# Patient Record
Sex: Male | Born: 1970 | Race: Black or African American | Hispanic: No | Marital: Single | Smoking: Former smoker
Health system: Southern US, Community
[De-identification: ages and names within clinical notes are randomized; demographics above are authoritative.]

## PROBLEM LIST (undated history)

## (undated) DIAGNOSIS — F419 Anxiety disorder, unspecified: Secondary | ICD-10-CM

## (undated) DIAGNOSIS — G8929 Other chronic pain: Secondary | ICD-10-CM

## (undated) DIAGNOSIS — I10 Essential (primary) hypertension: Secondary | ICD-10-CM

## (undated) HISTORY — DX: Anxiety disorder, unspecified: F41.9

## (undated) HISTORY — DX: Other chronic pain: G89.29

## (undated) HISTORY — DX: Essential (primary) hypertension: I10

## (undated) HISTORY — PX: OTHER SURGICAL HISTORY: SHX169

---

## 2001-05-03 ENCOUNTER — Encounter: Payer: Self-pay | Admitting: Orthopedic Surgery

## 2001-05-03 ENCOUNTER — Encounter (INDEPENDENT_AMBULATORY_CARE_PROVIDER_SITE_OTHER): Payer: Self-pay

## 2001-05-03 ENCOUNTER — Observation Stay (HOSPITAL_COMMUNITY): Admission: RE | Admit: 2001-05-03 | Discharge: 2001-05-04 | Payer: Self-pay | Admitting: Orthopedic Surgery

## 2001-07-17 ENCOUNTER — Emergency Department (HOSPITAL_COMMUNITY): Admission: EM | Admit: 2001-07-17 | Discharge: 2001-07-17 | Payer: Self-pay | Admitting: Emergency Medicine

## 2001-12-04 ENCOUNTER — Emergency Department (HOSPITAL_COMMUNITY): Admission: EM | Admit: 2001-12-04 | Discharge: 2001-12-04 | Payer: Self-pay | Admitting: Internal Medicine

## 2001-12-04 ENCOUNTER — Encounter: Payer: Self-pay | Admitting: Internal Medicine

## 2002-01-20 ENCOUNTER — Emergency Department (HOSPITAL_COMMUNITY): Admission: EM | Admit: 2002-01-20 | Discharge: 2002-01-20 | Payer: Self-pay | Admitting: Emergency Medicine

## 2002-02-10 ENCOUNTER — Encounter: Payer: Self-pay | Admitting: Neurosurgery

## 2002-02-14 ENCOUNTER — Ambulatory Visit (HOSPITAL_COMMUNITY): Admission: RE | Admit: 2002-02-14 | Discharge: 2002-02-14 | Payer: Self-pay | Admitting: Neurosurgery

## 2002-02-14 ENCOUNTER — Encounter: Payer: Self-pay | Admitting: Neurosurgery

## 2004-01-01 ENCOUNTER — Emergency Department (HOSPITAL_COMMUNITY): Admission: EM | Admit: 2004-01-01 | Discharge: 2004-01-02 | Payer: Self-pay | Admitting: *Deleted

## 2004-02-16 ENCOUNTER — Encounter: Admission: RE | Admit: 2004-02-16 | Discharge: 2004-02-16 | Payer: Self-pay | Admitting: Neurosurgery

## 2004-03-06 ENCOUNTER — Encounter: Admission: RE | Admit: 2004-03-06 | Discharge: 2004-03-06 | Payer: Self-pay | Admitting: Neurosurgery

## 2005-09-02 ENCOUNTER — Emergency Department (HOSPITAL_COMMUNITY): Admission: EM | Admit: 2005-09-02 | Discharge: 2005-09-02 | Payer: Self-pay | Admitting: Emergency Medicine

## 2006-04-08 ENCOUNTER — Ambulatory Visit (HOSPITAL_COMMUNITY): Admission: RE | Admit: 2006-04-08 | Discharge: 2006-04-08 | Payer: Self-pay | Admitting: Family Medicine

## 2007-01-20 ENCOUNTER — Emergency Department (HOSPITAL_COMMUNITY): Admission: EM | Admit: 2007-01-20 | Discharge: 2007-01-20 | Payer: Self-pay | Admitting: Emergency Medicine

## 2008-07-15 ENCOUNTER — Emergency Department (HOSPITAL_COMMUNITY): Admission: EM | Admit: 2008-07-15 | Discharge: 2008-07-15 | Payer: Self-pay | Admitting: *Deleted

## 2008-07-25 ENCOUNTER — Emergency Department (HOSPITAL_COMMUNITY): Admission: EM | Admit: 2008-07-25 | Discharge: 2008-07-25 | Payer: Self-pay | Admitting: Emergency Medicine

## 2010-12-31 ENCOUNTER — Emergency Department (HOSPITAL_COMMUNITY)
Admission: EM | Admit: 2010-12-31 | Discharge: 2010-12-31 | Payer: Self-pay | Source: Home / Self Care | Admitting: Emergency Medicine

## 2011-01-19 ENCOUNTER — Encounter: Payer: Self-pay | Admitting: Neurosurgery

## 2011-03-10 LAB — URINALYSIS, ROUTINE W REFLEX MICROSCOPIC
Nitrite: NEGATIVE
Specific Gravity, Urine: <1.005 — ABNORMAL LOW (ref 1.005–1.030)
Urobilinogen, UA: 0.2 mg/dL (ref 0.0–1.0)

## 2011-03-10 LAB — BASIC METABOLIC PANEL
Calcium: 9.1 mg/dL (ref 8.4–10.5)
GFR calc Af Amer: >60 mL/min (ref >60)
GFR calc non Af Amer: >60 mL/min (ref >60)
Sodium: 137 mEq/L (ref 135–145)

## 2011-03-10 LAB — CBC
Hemoglobin: 14.6 g/dL (ref 13.0–17.0)
MCHC: 34.7 g/dL (ref 30.0–36.0)
RBC: 4.65 MIL/uL (ref 4.22–5.81)
WBC: 9.1 10*3/uL (ref 4.0–10.5)

## 2011-03-10 LAB — DIFFERENTIAL
Basophils Relative: 0 % (ref 0–1)
Lymphs Abs: 1.5 10*3/uL (ref 0.7–4.0)
Monocytes Relative: 9 % (ref 3–12)
Neutro Abs: 6.4 10*3/uL (ref 1.7–7.7)
Neutrophils Relative %: 71 % (ref 43–77)

## 2011-05-16 NOTE — Op Note (Signed)
[place]. [facility]  Patient:    Louis Sandoval, Louis Sandoval                       MRN: [id number] Proc. Date: 05/03/01 Adm. Date:  [id number] Disc. 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  All of his symptoms are in the left leg.  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  All of his symptoms are in the left leg.  OPERATION PERFORMED:  Hemilaminectomy and microdiskectomy at L5-S1 on the left.  SURGEON:  Georges Lynch. Darrelyn Hillock, M.D.  ASSISTANT:  Javier Docker, M.D.  ANESTHESIA:  General.  DESCRIPTION OF PROCEDURE:  Under general anesthesia, with the patient in a spinal frame, he first had 1 gm of IV Ancef.  At this time a routine prepping and draping of the back was carried out.  At this point two needles were placed in the back for localization purposes and an x-ray was taken to verify L5-S1.  Later a second x-ray was taken.  At this time an incision was made over L5-S1.  Bleeders were identified and cauterized.  I then went down and separated the muscle from the lamina by sharp dissection and then inserted the [facility] retractors and a second x-ray was taken.  The radiologist verified our position at L5-S1.  We then carried out a hemilaminectomy in the usual fashion.  We then removed the ligamentum flavum with great care taken to protect the dura.  The microscope was brought in and we then gently retracted the dura with the DErrico retractor and identified the nerve root.  He had a large herniated disk which was central and to the left.  We cauterized the lateral recess veins with a bipolar.  I then went on and made a cruciate incision in the L5-S1 disk space and did a complete diskectomy.  We were able to easily reach across the other side  subligamentously and remove the remaining disk material.  Once this was done, we made multiple passes into the space and there were no other free fragments.  We then were easily able to run a hockey stick down the foramina and nerve root and that was free.  We thoroughly irrigated out the wound side.  Good hemostasis was maintained with thrombin soaked Gelfoam. The wound was closed in layers in the usual fashion. Sterile Neosporin dressing was applied. DD:  05/03/01 TD:  05/04/01 Job: [id number]

## 2011-05-16 NOTE — Op Note (Signed)
[place]. [facility]  Patient:    Louis Sandoval, RODELO Visit Number: [id number] MRN: [id number]          Service Type: DSU Location: 3000 [id number] Attending Physician:  Donn Pierini Dictated by:   Julio Sicks, M.D. Proc. 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  OPERATION PERFORMED:  Re-exploration of left L5-S1 laminotomy and microdiskectomy.  SURGEON:  Julio Sicks, M.D.  ASSISTANT:  Bradd Canary., M.D.  ANESTHESIA:  General endotracheal.  INDICATIONS FOR PROCEDURE:  Louis Sandoval is a 40 year old male who is status post previous left-sided L5-S1 laminotomy and diskectomy by Dr. Darrelyn Hillock approximately one year go.  The patient initially had some relieve and approximately four weeks after surgery developed recurrent left lower extremity radicular pain consistent with a left-sided S1 radiculopathy. Work-ups demonstrated a recurrent left-sided L5-S1 disk herniation with compression of the left-sided S1 nerve root.  The patient has failed conservative management.  He has decided to proceed with a re-do microdiskectomy in hopes of improving his symptoms.  DESCRIPTION OF PROCEDURE:  The patient was taken to the operating room and placed on the operating table in supine position.  After an adequate level of anesthesia was achieved, the patient was positioned prone onto a Wilson frame and appropriately padded.  The patients lumbar region was shaved and prepped sterilely.  A 10 blade was used to make a linear skin incision overlying the interspace.  This was carried down sharply in the midline.  Subperiosteal dissection was performed on the left side exposing the lamina and facet joints of L5  and S1.  The deep self-retaining retractor was placed.  Intraoperative x-ray was taken and the level was confirmed.  The old laminotomy site was then dissected free using dental instruments.  Epidural scarring was resected.  The laminotomy was slightly widened using Kerrison rongeurs.  The underlying thecal sac was identified.  Microscope was brought into the field and used for microdissection of the left-sided S1 nerve root and underlying disk herniation.  The left-sided S1 pedicle was identified as was the left-sided S1 nerve root.  Epidural scar was freed laterally.  The thecal sac and S1 nerve root were mobilized medially and held in place with a DErrico nerve root retractor.  The epidural scar was further lysed superiorly and the recurrent disk herniation was readily apparent.  With the thecal sac and nerve roots protected, the disk herniation was then incised with a 15 blade.  A wide disk space clean out was then achieved using pituitary rongeurs, upward angled pituitary rongeurs and Epstein curets.  All loose or obviously degenerated disk material was removed from the interspace.  All elements follow-up disk herniation were completely resected.  The thecal sac and nerve roots were found to be free of any further compression.  There was no evidence of injury to the thecal sac or nerve roots.  THe wound was then copiously irrigated with antibiotic solution.  Gelfoam was placed topically for hemostasis which was found to be good.  Microscope and retractor system were removed.  Hemostasis in the muscle was achieved with electrocautery.  The wound was then closed in layers with Vicryl suture.  Steri-Strips and sterile dressing were applied. There were no apparent complications.  The patient tolerated the procedure well and he returned to the recovery room postoperatively. Dictated by:   Julio Sicks, M.D. Attending Physician:  Donn Pierini DD:  02/14/02 TD:  02/14/02 Job:  [id number]

## 2016-04-29 DIAGNOSIS — M545 Low back pain: Secondary | ICD-10-CM | POA: Diagnosis not present

## 2016-04-29 DIAGNOSIS — Z6831 Body mass index (BMI) 31.0-31.9, adult: Secondary | ICD-10-CM | POA: Diagnosis not present

## 2016-04-29 DIAGNOSIS — I10 Essential (primary) hypertension: Secondary | ICD-10-CM | POA: Diagnosis not present

## 2016-07-02 DIAGNOSIS — E119 Type 2 diabetes mellitus without complications: Secondary | ICD-10-CM | POA: Diagnosis not present

## 2016-07-02 DIAGNOSIS — Z6830 Body mass index (BMI) 30.0-30.9, adult: Secondary | ICD-10-CM | POA: Diagnosis not present

## 2016-07-02 DIAGNOSIS — M545 Low back pain: Secondary | ICD-10-CM | POA: Diagnosis not present

## 2016-07-02 DIAGNOSIS — I10 Essential (primary) hypertension: Secondary | ICD-10-CM | POA: Diagnosis not present

## 2016-10-02 DIAGNOSIS — I10 Essential (primary) hypertension: Secondary | ICD-10-CM | POA: Diagnosis not present

## 2016-10-02 DIAGNOSIS — E119 Type 2 diabetes mellitus without complications: Secondary | ICD-10-CM | POA: Diagnosis not present

## 2016-10-02 DIAGNOSIS — Z6831 Body mass index (BMI) 31.0-31.9, adult: Secondary | ICD-10-CM | POA: Diagnosis not present

## 2016-10-02 DIAGNOSIS — M545 Low back pain: Secondary | ICD-10-CM | POA: Diagnosis not present

## 2016-12-02 DIAGNOSIS — E119 Type 2 diabetes mellitus without complications: Secondary | ICD-10-CM | POA: Diagnosis not present

## 2016-12-02 DIAGNOSIS — M545 Low back pain: Secondary | ICD-10-CM | POA: Diagnosis not present

## 2016-12-02 DIAGNOSIS — Z6832 Body mass index (BMI) 32.0-32.9, adult: Secondary | ICD-10-CM | POA: Diagnosis not present

## 2016-12-02 DIAGNOSIS — I10 Essential (primary) hypertension: Secondary | ICD-10-CM | POA: Diagnosis not present

## 2017-01-30 DIAGNOSIS — Z6832 Body mass index (BMI) 32.0-32.9, adult: Secondary | ICD-10-CM | POA: Diagnosis not present

## 2017-01-30 DIAGNOSIS — M545 Low back pain: Secondary | ICD-10-CM | POA: Diagnosis not present

## 2017-01-30 DIAGNOSIS — I10 Essential (primary) hypertension: Secondary | ICD-10-CM | POA: Diagnosis not present

## 2017-01-30 DIAGNOSIS — E119 Type 2 diabetes mellitus without complications: Secondary | ICD-10-CM | POA: Diagnosis not present

## 2017-03-30 DIAGNOSIS — M65222 Calcific tendinitis, left upper arm: Secondary | ICD-10-CM | POA: Diagnosis not present

## 2017-03-30 DIAGNOSIS — M545 Low back pain: Secondary | ICD-10-CM | POA: Diagnosis not present

## 2017-03-30 DIAGNOSIS — I10 Essential (primary) hypertension: Secondary | ICD-10-CM | POA: Diagnosis not present

## 2017-03-30 DIAGNOSIS — E119 Type 2 diabetes mellitus without complications: Secondary | ICD-10-CM | POA: Diagnosis not present

## 2017-06-01 DIAGNOSIS — E119 Type 2 diabetes mellitus without complications: Secondary | ICD-10-CM | POA: Diagnosis not present

## 2017-06-01 DIAGNOSIS — Z6833 Body mass index (BMI) 33.0-33.9, adult: Secondary | ICD-10-CM | POA: Diagnosis not present

## 2017-06-01 DIAGNOSIS — I10 Essential (primary) hypertension: Secondary | ICD-10-CM | POA: Diagnosis not present

## 2017-06-01 DIAGNOSIS — M545 Low back pain: Secondary | ICD-10-CM | POA: Diagnosis not present

## 2017-08-24 DIAGNOSIS — E119 Type 2 diabetes mellitus without complications: Secondary | ICD-10-CM | POA: Diagnosis not present

## 2017-08-24 DIAGNOSIS — I10 Essential (primary) hypertension: Secondary | ICD-10-CM | POA: Diagnosis not present

## 2017-08-24 DIAGNOSIS — M545 Low back pain: Secondary | ICD-10-CM | POA: Diagnosis not present

## 2017-08-24 DIAGNOSIS — Z6833 Body mass index (BMI) 33.0-33.9, adult: Secondary | ICD-10-CM | POA: Diagnosis not present

## 2017-08-28 DIAGNOSIS — E119 Type 2 diabetes mellitus without complications: Secondary | ICD-10-CM | POA: Diagnosis not present

## 2017-10-19 DIAGNOSIS — I10 Essential (primary) hypertension: Secondary | ICD-10-CM | POA: Diagnosis not present

## 2017-10-19 DIAGNOSIS — E119 Type 2 diabetes mellitus without complications: Secondary | ICD-10-CM | POA: Diagnosis not present

## 2017-10-19 DIAGNOSIS — M545 Low back pain: Secondary | ICD-10-CM | POA: Diagnosis not present

## 2017-10-19 DIAGNOSIS — Z6832 Body mass index (BMI) 32.0-32.9, adult: Secondary | ICD-10-CM | POA: Diagnosis not present

## 2017-12-24 DIAGNOSIS — Z6832 Body mass index (BMI) 32.0-32.9, adult: Secondary | ICD-10-CM | POA: Diagnosis not present

## 2017-12-24 DIAGNOSIS — M545 Low back pain: Secondary | ICD-10-CM | POA: Diagnosis not present

## 2017-12-24 DIAGNOSIS — E119 Type 2 diabetes mellitus without complications: Secondary | ICD-10-CM | POA: Diagnosis not present

## 2017-12-24 DIAGNOSIS — I10 Essential (primary) hypertension: Secondary | ICD-10-CM | POA: Diagnosis not present

## 2018-02-23 DIAGNOSIS — I10 Essential (primary) hypertension: Secondary | ICD-10-CM | POA: Diagnosis not present

## 2018-02-23 DIAGNOSIS — D213 Benign neoplasm of connective and other soft tissue of thorax: Secondary | ICD-10-CM | POA: Diagnosis not present

## 2018-02-23 DIAGNOSIS — I781 Nevus, non-neoplastic: Secondary | ICD-10-CM | POA: Diagnosis not present

## 2018-02-23 DIAGNOSIS — E119 Type 2 diabetes mellitus without complications: Secondary | ICD-10-CM | POA: Diagnosis not present

## 2018-02-23 DIAGNOSIS — Z6832 Body mass index (BMI) 32.0-32.9, adult: Secondary | ICD-10-CM | POA: Diagnosis not present

## 2018-02-23 DIAGNOSIS — M545 Low back pain: Secondary | ICD-10-CM | POA: Diagnosis not present

## 2018-02-23 DIAGNOSIS — Z6831 Body mass index (BMI) 31.0-31.9, adult: Secondary | ICD-10-CM | POA: Diagnosis not present

## 2018-04-19 DIAGNOSIS — E119 Type 2 diabetes mellitus without complications: Secondary | ICD-10-CM | POA: Diagnosis not present

## 2018-04-19 DIAGNOSIS — I10 Essential (primary) hypertension: Secondary | ICD-10-CM | POA: Diagnosis not present

## 2018-04-19 DIAGNOSIS — M7918 Myalgia, other site: Secondary | ICD-10-CM | POA: Diagnosis not present

## 2018-04-19 DIAGNOSIS — M545 Low back pain: Secondary | ICD-10-CM | POA: Diagnosis not present

## 2018-07-02 DIAGNOSIS — M545 Low back pain: Secondary | ICD-10-CM | POA: Diagnosis not present

## 2018-07-02 DIAGNOSIS — M7918 Myalgia, other site: Secondary | ICD-10-CM | POA: Diagnosis not present

## 2018-07-02 DIAGNOSIS — I10 Essential (primary) hypertension: Secondary | ICD-10-CM | POA: Diagnosis not present

## 2018-07-02 DIAGNOSIS — E119 Type 2 diabetes mellitus without complications: Secondary | ICD-10-CM | POA: Diagnosis not present

## 2018-07-08 DIAGNOSIS — I201 Angina pectoris with documented spasm: Secondary | ICD-10-CM | POA: Diagnosis not present

## 2018-07-09 DIAGNOSIS — I201 Angina pectoris with documented spasm: Secondary | ICD-10-CM | POA: Diagnosis not present

## 2018-07-20 DIAGNOSIS — Z6830 Body mass index (BMI) 30.0-30.9, adult: Secondary | ICD-10-CM | POA: Diagnosis not present

## 2018-07-20 DIAGNOSIS — I10 Essential (primary) hypertension: Secondary | ICD-10-CM | POA: Diagnosis not present

## 2018-07-29 DIAGNOSIS — E669 Obesity, unspecified: Secondary | ICD-10-CM | POA: Diagnosis not present

## 2018-07-29 DIAGNOSIS — Z6832 Body mass index (BMI) 32.0-32.9, adult: Secondary | ICD-10-CM | POA: Diagnosis not present

## 2018-07-29 DIAGNOSIS — Z008 Encounter for other general examination: Secondary | ICD-10-CM | POA: Diagnosis not present

## 2018-07-29 DIAGNOSIS — Z1339 Encounter for screening examination for other mental health and behavioral disorders: Secondary | ICD-10-CM | POA: Diagnosis not present

## 2018-09-14 DIAGNOSIS — Z6830 Body mass index (BMI) 30.0-30.9, adult: Secondary | ICD-10-CM | POA: Diagnosis not present

## 2018-09-14 DIAGNOSIS — Z0000 Encounter for general adult medical examination without abnormal findings: Secondary | ICD-10-CM | POA: Diagnosis not present

## 2018-09-14 DIAGNOSIS — Z6829 Body mass index (BMI) 29.0-29.9, adult: Secondary | ICD-10-CM | POA: Diagnosis not present

## 2018-09-14 DIAGNOSIS — I10 Essential (primary) hypertension: Secondary | ICD-10-CM | POA: Diagnosis not present

## 2018-11-01 DIAGNOSIS — I10 Essential (primary) hypertension: Secondary | ICD-10-CM | POA: Diagnosis not present

## 2018-11-01 DIAGNOSIS — Z79891 Long term (current) use of opiate analgesic: Secondary | ICD-10-CM | POA: Diagnosis not present

## 2018-11-01 DIAGNOSIS — M545 Low back pain: Secondary | ICD-10-CM | POA: Diagnosis not present

## 2018-11-01 DIAGNOSIS — E119 Type 2 diabetes mellitus without complications: Secondary | ICD-10-CM | POA: Diagnosis not present

## 2018-11-01 DIAGNOSIS — Z6829 Body mass index (BMI) 29.0-29.9, adult: Secondary | ICD-10-CM | POA: Diagnosis not present

## 2018-12-31 DIAGNOSIS — M545 Low back pain: Secondary | ICD-10-CM | POA: Diagnosis not present

## 2018-12-31 DIAGNOSIS — I10 Essential (primary) hypertension: Secondary | ICD-10-CM | POA: Diagnosis not present

## 2018-12-31 DIAGNOSIS — Z6829 Body mass index (BMI) 29.0-29.9, adult: Secondary | ICD-10-CM | POA: Diagnosis not present

## 2018-12-31 DIAGNOSIS — E119 Type 2 diabetes mellitus without complications: Secondary | ICD-10-CM | POA: Diagnosis not present

## 2019-01-27 DIAGNOSIS — E669 Obesity, unspecified: Secondary | ICD-10-CM | POA: Diagnosis not present

## 2019-01-27 DIAGNOSIS — Z008 Encounter for other general examination: Secondary | ICD-10-CM | POA: Diagnosis not present

## 2019-01-27 DIAGNOSIS — R7303 Prediabetes: Secondary | ICD-10-CM | POA: Diagnosis not present

## 2019-01-27 DIAGNOSIS — Z6830 Body mass index (BMI) 30.0-30.9, adult: Secondary | ICD-10-CM | POA: Diagnosis not present

## 2019-03-01 DIAGNOSIS — E119 Type 2 diabetes mellitus without complications: Secondary | ICD-10-CM | POA: Diagnosis not present

## 2019-03-01 DIAGNOSIS — Z6827 Body mass index (BMI) 27.0-27.9, adult: Secondary | ICD-10-CM | POA: Diagnosis not present

## 2019-03-01 DIAGNOSIS — M545 Low back pain: Secondary | ICD-10-CM | POA: Diagnosis not present

## 2019-03-01 DIAGNOSIS — I10 Essential (primary) hypertension: Secondary | ICD-10-CM | POA: Diagnosis not present

## 2019-05-02 DIAGNOSIS — E119 Type 2 diabetes mellitus without complications: Secondary | ICD-10-CM | POA: Diagnosis not present

## 2019-05-02 DIAGNOSIS — M545 Low back pain: Secondary | ICD-10-CM | POA: Diagnosis not present

## 2019-05-02 DIAGNOSIS — I10 Essential (primary) hypertension: Secondary | ICD-10-CM | POA: Diagnosis not present

## 2019-05-02 DIAGNOSIS — Z6829 Body mass index (BMI) 29.0-29.9, adult: Secondary | ICD-10-CM | POA: Diagnosis not present

## 2019-07-20 DIAGNOSIS — Z6830 Body mass index (BMI) 30.0-30.9, adult: Secondary | ICD-10-CM | POA: Diagnosis not present

## 2019-07-20 DIAGNOSIS — M545 Low back pain: Secondary | ICD-10-CM | POA: Diagnosis not present

## 2019-07-20 DIAGNOSIS — I10 Essential (primary) hypertension: Secondary | ICD-10-CM | POA: Diagnosis not present

## 2019-07-20 DIAGNOSIS — E119 Type 2 diabetes mellitus without complications: Secondary | ICD-10-CM | POA: Diagnosis not present

## 2019-09-12 DIAGNOSIS — E119 Type 2 diabetes mellitus without complications: Secondary | ICD-10-CM | POA: Diagnosis not present

## 2019-09-12 DIAGNOSIS — I10 Essential (primary) hypertension: Secondary | ICD-10-CM | POA: Diagnosis not present

## 2019-09-12 DIAGNOSIS — M545 Low back pain: Secondary | ICD-10-CM | POA: Diagnosis not present

## 2019-09-12 DIAGNOSIS — K2970 Gastritis, unspecified, without bleeding: Secondary | ICD-10-CM | POA: Diagnosis not present

## 2019-09-15 DIAGNOSIS — E119 Type 2 diabetes mellitus without complications: Secondary | ICD-10-CM | POA: Diagnosis not present

## 2019-09-15 DIAGNOSIS — B9681 Helicobacter pylori [H. pylori] as the cause of diseases classified elsewhere: Secondary | ICD-10-CM | POA: Diagnosis not present

## 2019-09-15 DIAGNOSIS — K2970 Gastritis, unspecified, without bleeding: Secondary | ICD-10-CM | POA: Diagnosis not present

## 2019-09-15 DIAGNOSIS — I10 Essential (primary) hypertension: Secondary | ICD-10-CM | POA: Diagnosis not present

## 2019-11-15 DIAGNOSIS — Z6830 Body mass index (BMI) 30.0-30.9, adult: Secondary | ICD-10-CM | POA: Diagnosis not present

## 2019-11-15 DIAGNOSIS — Z0000 Encounter for general adult medical examination without abnormal findings: Secondary | ICD-10-CM | POA: Diagnosis not present

## 2020-01-16 DIAGNOSIS — Z6830 Body mass index (BMI) 30.0-30.9, adult: Secondary | ICD-10-CM | POA: Diagnosis not present

## 2020-01-16 DIAGNOSIS — I10 Essential (primary) hypertension: Secondary | ICD-10-CM | POA: Diagnosis not present

## 2020-01-16 DIAGNOSIS — M545 Low back pain: Secondary | ICD-10-CM | POA: Diagnosis not present

## 2020-01-16 DIAGNOSIS — E119 Type 2 diabetes mellitus without complications: Secondary | ICD-10-CM | POA: Diagnosis not present

## 2020-03-14 DIAGNOSIS — I10 Essential (primary) hypertension: Secondary | ICD-10-CM | POA: Diagnosis not present

## 2020-03-14 DIAGNOSIS — M545 Low back pain: Secondary | ICD-10-CM | POA: Diagnosis not present

## 2020-03-14 DIAGNOSIS — E119 Type 2 diabetes mellitus without complications: Secondary | ICD-10-CM | POA: Diagnosis not present

## 2020-03-14 DIAGNOSIS — Z6830 Body mass index (BMI) 30.0-30.9, adult: Secondary | ICD-10-CM | POA: Diagnosis not present

## 2020-04-30 DIAGNOSIS — I10 Essential (primary) hypertension: Secondary | ICD-10-CM | POA: Diagnosis not present

## 2020-04-30 DIAGNOSIS — Z6830 Body mass index (BMI) 30.0-30.9, adult: Secondary | ICD-10-CM | POA: Diagnosis not present

## 2020-04-30 DIAGNOSIS — E119 Type 2 diabetes mellitus without complications: Secondary | ICD-10-CM | POA: Diagnosis not present

## 2020-04-30 DIAGNOSIS — M545 Low back pain: Secondary | ICD-10-CM | POA: Diagnosis not present

## 2020-07-04 DIAGNOSIS — I10 Essential (primary) hypertension: Secondary | ICD-10-CM | POA: Diagnosis not present

## 2020-07-04 DIAGNOSIS — Z6831 Body mass index (BMI) 31.0-31.9, adult: Secondary | ICD-10-CM | POA: Diagnosis not present

## 2020-07-04 DIAGNOSIS — E119 Type 2 diabetes mellitus without complications: Secondary | ICD-10-CM | POA: Diagnosis not present

## 2020-07-04 DIAGNOSIS — M545 Low back pain: Secondary | ICD-10-CM | POA: Diagnosis not present

## 2020-09-04 DIAGNOSIS — Z6831 Body mass index (BMI) 31.0-31.9, adult: Secondary | ICD-10-CM | POA: Diagnosis not present

## 2020-09-04 DIAGNOSIS — I10 Essential (primary) hypertension: Secondary | ICD-10-CM | POA: Diagnosis not present

## 2020-09-04 DIAGNOSIS — E1143 Type 2 diabetes mellitus with diabetic autonomic (poly)neuropathy: Secondary | ICD-10-CM | POA: Diagnosis not present

## 2020-09-04 DIAGNOSIS — M545 Low back pain: Secondary | ICD-10-CM | POA: Diagnosis not present

## 2020-11-08 DIAGNOSIS — E1143 Type 2 diabetes mellitus with diabetic autonomic (poly)neuropathy: Secondary | ICD-10-CM | POA: Diagnosis not present

## 2020-11-08 DIAGNOSIS — F411 Generalized anxiety disorder: Secondary | ICD-10-CM | POA: Diagnosis not present

## 2020-11-08 DIAGNOSIS — I10 Essential (primary) hypertension: Secondary | ICD-10-CM | POA: Diagnosis not present

## 2020-11-08 DIAGNOSIS — M545 Low back pain, unspecified: Secondary | ICD-10-CM | POA: Diagnosis not present

## 2021-01-02 DIAGNOSIS — F411 Generalized anxiety disorder: Secondary | ICD-10-CM | POA: Diagnosis not present

## 2021-01-02 DIAGNOSIS — E1143 Type 2 diabetes mellitus with diabetic autonomic (poly)neuropathy: Secondary | ICD-10-CM | POA: Diagnosis not present

## 2021-01-02 DIAGNOSIS — M545 Low back pain, unspecified: Secondary | ICD-10-CM | POA: Diagnosis not present

## 2021-01-02 DIAGNOSIS — I10 Essential (primary) hypertension: Secondary | ICD-10-CM | POA: Diagnosis not present

## 2021-02-01 DIAGNOSIS — I10 Essential (primary) hypertension: Secondary | ICD-10-CM | POA: Diagnosis not present

## 2021-02-01 DIAGNOSIS — M545 Low back pain, unspecified: Secondary | ICD-10-CM | POA: Diagnosis not present

## 2021-02-01 DIAGNOSIS — E1143 Type 2 diabetes mellitus with diabetic autonomic (poly)neuropathy: Secondary | ICD-10-CM | POA: Diagnosis not present

## 2021-03-04 DIAGNOSIS — M545 Low back pain, unspecified: Secondary | ICD-10-CM | POA: Diagnosis not present

## 2021-03-04 DIAGNOSIS — I10 Essential (primary) hypertension: Secondary | ICD-10-CM | POA: Diagnosis not present

## 2021-03-04 DIAGNOSIS — F411 Generalized anxiety disorder: Secondary | ICD-10-CM | POA: Diagnosis not present

## 2021-03-04 DIAGNOSIS — E1143 Type 2 diabetes mellitus with diabetic autonomic (poly)neuropathy: Secondary | ICD-10-CM | POA: Diagnosis not present

## 2021-03-06 ENCOUNTER — Encounter (INDEPENDENT_AMBULATORY_CARE_PROVIDER_SITE_OTHER): Payer: Self-pay | Admitting: *Deleted

## 2021-05-08 DIAGNOSIS — Z0000 Encounter for general adult medical examination without abnormal findings: Secondary | ICD-10-CM | POA: Diagnosis not present

## 2021-05-08 DIAGNOSIS — E1143 Type 2 diabetes mellitus with diabetic autonomic (poly)neuropathy: Secondary | ICD-10-CM | POA: Diagnosis not present

## 2021-05-08 DIAGNOSIS — M545 Low back pain, unspecified: Secondary | ICD-10-CM | POA: Diagnosis not present

## 2021-05-08 DIAGNOSIS — I10 Essential (primary) hypertension: Secondary | ICD-10-CM | POA: Diagnosis not present

## 2021-05-08 DIAGNOSIS — F411 Generalized anxiety disorder: Secondary | ICD-10-CM | POA: Diagnosis not present

## 2021-07-10 DIAGNOSIS — F411 Generalized anxiety disorder: Secondary | ICD-10-CM | POA: Diagnosis not present

## 2021-07-10 DIAGNOSIS — M545 Low back pain, unspecified: Secondary | ICD-10-CM | POA: Diagnosis not present

## 2021-07-10 DIAGNOSIS — I10 Essential (primary) hypertension: Secondary | ICD-10-CM | POA: Diagnosis not present

## 2021-07-10 DIAGNOSIS — E1143 Type 2 diabetes mellitus with diabetic autonomic (poly)neuropathy: Secondary | ICD-10-CM | POA: Diagnosis not present

## 2021-09-09 DIAGNOSIS — E1143 Type 2 diabetes mellitus with diabetic autonomic (poly)neuropathy: Secondary | ICD-10-CM | POA: Diagnosis not present

## 2021-09-12 ENCOUNTER — Encounter (INDEPENDENT_AMBULATORY_CARE_PROVIDER_SITE_OTHER): Payer: Self-pay | Admitting: *Deleted

## 2021-10-02 ENCOUNTER — Encounter: Payer: Self-pay | Admitting: Internal Medicine

## 2021-11-13 DIAGNOSIS — Z125 Encounter for screening for malignant neoplasm of prostate: Secondary | ICD-10-CM | POA: Diagnosis not present

## 2021-11-13 DIAGNOSIS — I10 Essential (primary) hypertension: Secondary | ICD-10-CM | POA: Diagnosis not present

## 2021-11-13 DIAGNOSIS — K921 Melena: Secondary | ICD-10-CM | POA: Diagnosis not present

## 2021-11-13 DIAGNOSIS — E1143 Type 2 diabetes mellitus with diabetic autonomic (poly)neuropathy: Secondary | ICD-10-CM | POA: Diagnosis not present

## 2021-11-13 DIAGNOSIS — F411 Generalized anxiety disorder: Secondary | ICD-10-CM | POA: Diagnosis not present

## 2022-01-09 DIAGNOSIS — E1143 Type 2 diabetes mellitus with diabetic autonomic (poly)neuropathy: Secondary | ICD-10-CM | POA: Diagnosis not present

## 2022-01-14 ENCOUNTER — Other Ambulatory Visit: Payer: Self-pay | Admitting: Internal Medicine

## 2022-01-14 ENCOUNTER — Other Ambulatory Visit (HOSPITAL_COMMUNITY): Payer: Self-pay | Admitting: Internal Medicine

## 2022-01-14 DIAGNOSIS — G4489 Other headache syndrome: Secondary | ICD-10-CM

## 2022-01-29 ENCOUNTER — Other Ambulatory Visit: Payer: Self-pay

## 2022-01-29 ENCOUNTER — Ambulatory Visit (HOSPITAL_COMMUNITY)
Admission: RE | Admit: 2022-01-29 | Discharge: 2022-01-29 | Disposition: A | Discharge status: Home / Self Care | Payer: BC Managed Care – PPO | Source: Ambulatory Visit | Attending: Internal Medicine | Admitting: Internal Medicine

## 2022-01-29 DIAGNOSIS — G4489 Other headache syndrome: Secondary | ICD-10-CM | POA: Insufficient documentation

## 2022-01-29 DIAGNOSIS — R519 Headache, unspecified: Secondary | ICD-10-CM | POA: Diagnosis not present

## 2022-02-19 NOTE — Progress Notes (Signed)
Referring Provider: Neale Burly, MD Primary Care Physician:  Neale Burly, MD Primary Gastroenterologist:  Dr. Abbey Chatters  Chief Complaint  Patient presents with   Colonoscopy    Never had tcs. Occ constipation. Mother had breast cancer. Grandfather had stomach cancer.    HPI:   Louis Sandoval is a 51 y.o. male presenting today at the request of Louis Sandoval, Louis Dada, MD for consult colonoscopy, OV due to meds and constipation.   Today:  Patient reports he has never had a colonoscopy.  Admits to occasional constipation. Takes norco daily for back and knee pain. Usually with Bms 1-2 times a day. Occasional use of OTC stool softener. Last used about 1 month ago. Has increased fruit and water intake. Occasional toilet tissue hematochezia, intermittent for about 1 year. No rectal pain. No melena. No unintentional weight loss. Trying to limit fried foods and cut back on portion sizes. Staying around 235 lbs- 240 lbs. Trying to get to 230 lbs.   Takes Protonix once every couple of weeks, can be as frequent as a couple times a week. Feels like this contributes to constipation. Started Protonix a few years ago due to upper abdominal pain. Burning sensation. This usually occurs a couple times a month. Depends on what he eats. Triggered by tomato based products. No heartburn symptoms. No dysphasia.   Occasional ibuprofen 800 mg- once a month. No BC powders or goody powders.   Also reports occasional slight discomfort in his chest.  Had a treadmill stress test last year that was normal. No change in symptoms.  Denies palpitations or shortness of breath.  Mother with history of breast cancer.  Maternal grandfather with history of stomach cancer.   Past Medical History:  Diagnosis Date   Anxiety    Chronic back pain    Chronic knee pain    left, has a rod   HTN (hypertension)     Past Surgical History:  Procedure Laterality Date   rod placed in left leg      Current Outpatient Medications   Medication Sig Dispense Refill   amLODipine (NORVASC) 5 MG tablet Take 5 mg by mouth daily.     aspirin EC 81 MG tablet Take 81 mg by mouth daily. Swallow whole.     busPIRone (BUSPAR) 10 MG tablet Take 10 mg by mouth 2 (two) times daily.     gabapentin (NEURONTIN) 300 MG capsule Take 300 mg by mouth 3 (three) times daily.     HYDROcodone-acetaminophen (NORCO) 10-325 MG tablet Take 1 tablet by mouth 3 (three) times daily.     loratadine (CLARITIN) 10 MG tablet Take 1 tablet by mouth daily.     Multiple Vitamin (MULTIVITAMIN) tablet Take 1 tablet by mouth daily.     pantoprazole (PROTONIX) 40 MG tablet Take 40 mg by mouth daily.     potassium chloride SA (KLOR-CON M) 20 MEQ tablet Take 20 mEq by mouth daily.     No current facility-administered medications for this visit.    Allergies as of 02/20/2022 - Review Complete 02/20/2022  Allergen Reaction Noted   Penicillins Anaphylaxis 02/20/2022    Family History  Problem Relation Age of Onset   Breast cancer Mother    Gastric cancer Maternal Grandfather    Colon cancer Neg Hx     Social History   Socioeconomic History   Marital status: Single    Spouse name: Not on file   Number of children: Not on file   Years  of education: Not on file   Highest education level: Not on file  Occupational History   Not on file  Tobacco Use   Smoking status: Former    Types: Cigarettes   Smokeless tobacco: Never  Substance and Sexual Activity   Alcohol use: Yes    Comment: occ now; more etoh in past   Drug use: Not Currently   Sexual activity: Not on file  Other Topics Concern   Not on file  Social History Narrative   Not on file   Social Determinants of Health   Financial Resource Strain: Not on file  Food Insecurity: Not on file  Transportation Needs: Not on file  Physical Activity: Not on file  Stress: Not on file  Social Connections: Not on file  Intimate Partner Violence: Not on file    Review of Systems: Gen: Denies any  fever, chills, cold or flulike symptoms, presyncope, syncope. CV: See HPI Resp: Denies shortness of breath or cough. GI: See HPI GU : Denies urinary burning, urinary frequency, urinary hesitancy MS: Admits to chronic back and left knee pain. Derm: Denies rash. Psych: Denies depression, anxiety. Heme: See HPI  Physical Exam: BP 136/86    Pulse 60    Temp (!) 96.8 F (36 C) (Temporal)    Ht 6\' 1"  (1.854 m)    Wt 242 lb 12.8 oz (110.1 kg)    BMI 32.03 kg/m  General:   Alert and oriented. Pleasant and cooperative. Well-nourished and well-developed.  Head:  Normocephalic and atraumatic. Eyes:  Without icterus, sclera clear and conjunctiva pink.  Ears:  Normal auditory acuity. Lungs:  Clear to auscultation bilaterally. No wheezes, rales, or rhonchi. No distress.  Heart:  S1, S2 present without murmurs appreciated.  Abdomen:  +BS, soft, non-tender and non-distended. No HSM noted. No guarding or rebound. No masses appreciated.  Rectal:  Deferred  Msk:  Symmetrical without gross deformities. Normal posture. Extremities:  Without edema. Neurologic:  Alert and  oriented x4;  grossly normal neurologically. Skin:  Intact without significant lesions or rashes. Psych: Normal mood and affect.    Assessment: 51 year old male with history of chronic back and knee pain, HTN, anxiety, presenting today at the request of Dr.  Sherrie Sport for consult colonoscopy, also reporting intermittent hematochezia, occasional constipation, and epigastric burning.   Rectal bleeding:  1 year history of intermittent toilet tissue hematochezia without associated rectal pain.  Denies abdominal pain, melena, or unintentional weight loss.  He does have history of mild intermittent constipation discussed below.  No prior colonoscopy.  No family history of colon cancer.  I suspect rectal bleeding could be from benign anorectal source such as hemorrhoids, but unable to rule out colon polyps or malignancy.  Needs colonoscopy for  further evaluation.  We will also check CBC to evaluate for anemia.  Constipation: Occasional constipation in the setting of Norco and possibly influenced by Protonix as well.  Uses OTC stool softeners as needed, but has not needed it about 1 month as he has increased fruit and water consumption.  Now having 1-2 BMs daily.  Encouraged to continue with his dietary adjustments and recommended adding Benefiber or Metamucil daily.  Epigastric pain: Few year history of intermittent epigastric burning, often triggered by tomato based products/spicy foods, taking Protonix as needed, usually once every couple of weeks, but can be as frequent as a couple times a week.  Denies nausea, vomiting, or typical heartburn symptoms.  No unintentional weight loss.  Takes 81 mg aspirin daily,  800 mg ibuprofen about once a month.  Maternal grandfather with history of gastric cancer.  I suspect he has gastritis or possibly atypical GERD, but cannot rule out H. pylori, PUD, or malignancy.  Offered EGD for further evaluation, but patient preferred to hold off for now.  Recommended holding Protonix x2 weeks and checking for H. pylori.   Plan:  Proceed with colonoscopy with propofol with Dr. Abbey Chatters in the near future.The risks, benefits, and alternatives have been discussed with the patient in detail. The patient states understanding and desires to proceed. ASA 2 CBC. Hold Protonix and avoid all over-the-counter antacid medications x2 weeks, then complete H. pylori breath test. Avoid known dietary triggers of epigastric burning. Avoid NSAID products. Start Metamucil or Benefiber daily. Follow-up after procedure.  Advised to call sooner with questions or concerns.   Aliene Altes, PA-C [facility] 02/20/2022

## 2022-02-20 ENCOUNTER — Ambulatory Visit (INDEPENDENT_AMBULATORY_CARE_PROVIDER_SITE_OTHER): Payer: BC Managed Care – PPO | Admitting: Gastroenterology

## 2022-02-20 ENCOUNTER — Other Ambulatory Visit: Payer: Self-pay

## 2022-02-20 ENCOUNTER — Encounter: Payer: Self-pay | Admitting: Gastroenterology

## 2022-02-20 VITALS — BP 136/86 | HR 60 | Temp 96.8°F | Ht 73.0 in | Wt 242.8 lb

## 2022-02-20 DIAGNOSIS — R1013 Epigastric pain: Secondary | ICD-10-CM | POA: Diagnosis not present

## 2022-02-20 DIAGNOSIS — K5900 Constipation, unspecified: Secondary | ICD-10-CM | POA: Diagnosis not present

## 2022-02-20 DIAGNOSIS — K625 Hemorrhage of anus and rectum: Secondary | ICD-10-CM

## 2022-02-20 LAB — CBC WITH DIFFERENTIAL/PLATELET
Absolute Monocytes: 469 cells/uL (ref 200–950)
Basophils Absolute: 51 cells/uL (ref 0–200)
Basophils Relative: 1.1 %
Eosinophils Absolute: 340 cells/uL (ref 15–500)
Eosinophils Relative: 7.4 %
HCT: 45.8 % (ref 38.5–50.0)
Hemoglobin: 15.4 g/dL (ref 13.2–17.1)
Lymphs Abs: 1835 cells/uL (ref 850–3900)
MCH: 30.7 pg (ref 27.0–33.0)
MCHC: 33.6 g/dL (ref 32.0–36.0)
MCV: 91.4 fL (ref 80.0–100.0)
MPV: 12.4 fL (ref 7.5–12.5)
Monocytes Relative: 10.2 %
Neutro Abs: 1904 cells/uL (ref 1500–7800)
Neutrophils Relative %: 41.4 %
Platelets: 200 10*3/uL (ref 140–400)
RBC: 5.01 10*6/uL (ref 4.20–5.80)
RDW: 13 % (ref 11.0–15.0)
Total Lymphocyte: 39.9 %
WBC: 4.6 10*3/uL (ref 3.8–10.8)

## 2022-02-20 NOTE — Patient Instructions (Addendum)
Please hold Protonix for the next 14 days, then complete H pylori breath test at [facility].  During this 14-day.,  Also avoid any over-the-counter antacid medications including Tums, Pepcid, Pepto-Bismol, etc.  I would also like to check your hemoglobin due to your occasional rectal bleeding. You can have this completed now at [facility].   Avoid common triggers of your upper abdominal pain.   Avoid NSAID products including ibuprofen, Aleve, Advil, naproxen, BC powders, Goody powders, and anything that says "NSAID" on the package.  We will arrange for you to have a colonoscopy in the near future with Dr. Abbey Chatters.   Start metamucil or benefiber daily to help with mild constipation. Follow instructions on container.   We will follow-up with you after your procedures. Do not hesitate to call with questions or concerns prior.   It was a pleasure to meet you today!   Aliene Altes, PA-C [facility]

## 2022-02-26 ENCOUNTER — Encounter (INDEPENDENT_AMBULATORY_CARE_PROVIDER_SITE_OTHER): Payer: Self-pay | Admitting: *Deleted

## 2022-03-05 DIAGNOSIS — E1143 Type 2 diabetes mellitus with diabetic autonomic (poly)neuropathy: Secondary | ICD-10-CM | POA: Diagnosis not present

## 2022-03-10 DIAGNOSIS — R1013 Epigastric pain: Secondary | ICD-10-CM | POA: Diagnosis not present

## 2022-03-12 LAB — H. PYLORI BREATH TEST: H. pylori Breath Test: NOT DETECTED

## 2022-03-13 ENCOUNTER — Telehealth: Payer: Self-pay | Admitting: *Deleted

## 2022-03-13 NOTE — Telephone Encounter (Signed)
LMOVM to call back to schedule TCS with Dr. Carver, ASA 2 

## 2022-03-18 ENCOUNTER — Other Ambulatory Visit: Payer: Self-pay

## 2022-03-18 MED ORDER — CLENPIQ 10-3.5-12 MG-GM -GM/160ML PO SOLN: 1.0000 | Freq: Once | ORAL | 0 refills | Status: AC

## 2022-03-18 NOTE — Telephone Encounter (Signed)
Called pt, TCS scheduled for 04/18/22 at 12:15pm. Rx for prep sent to pharmacy. Orders entered. Instructions mailed. ?

## 2022-04-07 ENCOUNTER — Telehealth: Payer: Self-pay | Admitting: Internal Medicine

## 2022-04-07 MED ORDER — PEG 3350-KCL-NA BICARB-NACL 420 G PO SOLR: 4000.0000 mL | ORAL | 0 refills | Status: DC

## 2022-04-07 NOTE — Telephone Encounter (Signed)
Patient called and said that the prep that was sent in was going to cost him 100$   is there anything else he can try  ?

## 2022-04-07 NOTE — Telephone Encounter (Signed)
Rx for Trilyte sent to pharmacy. New instructions faxed to [facility].  ? ?Called pt, informed him new prep rx was sent and instructions were faxed to pharmacy for him to pickup with prep. ?

## 2022-04-07 NOTE — Addendum Note (Signed)
Addended by: Hassan Rowan on: 04/07/2022 03:33 PM ? ? Modules accepted: Orders ? ?

## 2022-04-18 ENCOUNTER — Ambulatory Visit (HOSPITAL_COMMUNITY)
Admission: RE | Admit: 2022-04-18 | Discharge: 2022-04-18 | Disposition: A | Discharge status: Home / Self Care | Payer: BC Managed Care – PPO | Attending: Internal Medicine | Admitting: Internal Medicine

## 2022-04-18 ENCOUNTER — Encounter (HOSPITAL_COMMUNITY)
Admission: RE | Disposition: A | Discharge status: Home / Self Care | Payer: Self-pay | Source: Home / Self Care | Attending: Internal Medicine

## 2022-04-18 ENCOUNTER — Ambulatory Visit (HOSPITAL_COMMUNITY): Payer: BC Managed Care – PPO | Admitting: Certified Registered"

## 2022-04-18 ENCOUNTER — Other Ambulatory Visit: Payer: Self-pay

## 2022-04-18 ENCOUNTER — Encounter (HOSPITAL_COMMUNITY): Payer: Self-pay

## 2022-04-18 DIAGNOSIS — K648 Other hemorrhoids: Secondary | ICD-10-CM | POA: Diagnosis not present

## 2022-04-18 DIAGNOSIS — K5730 Diverticulosis of large intestine without perforation or abscess without bleeding: Secondary | ICD-10-CM | POA: Diagnosis not present

## 2022-04-18 DIAGNOSIS — Z87891 Personal history of nicotine dependence: Secondary | ICD-10-CM | POA: Diagnosis not present

## 2022-04-18 DIAGNOSIS — F419 Anxiety disorder, unspecified: Secondary | ICD-10-CM | POA: Insufficient documentation

## 2022-04-18 DIAGNOSIS — D128 Benign neoplasm of rectum: Secondary | ICD-10-CM | POA: Diagnosis not present

## 2022-04-18 DIAGNOSIS — K621 Rectal polyp: Secondary | ICD-10-CM

## 2022-04-18 DIAGNOSIS — K635 Polyp of colon: Secondary | ICD-10-CM | POA: Diagnosis not present

## 2022-04-18 DIAGNOSIS — Z1211 Encounter for screening for malignant neoplasm of colon: Secondary | ICD-10-CM | POA: Insufficient documentation

## 2022-04-18 DIAGNOSIS — D124 Benign neoplasm of descending colon: Secondary | ICD-10-CM | POA: Diagnosis not present

## 2022-04-18 DIAGNOSIS — I10 Essential (primary) hypertension: Secondary | ICD-10-CM | POA: Diagnosis not present

## 2022-04-18 HISTORY — PX: POLYPECTOMY: SHX5525

## 2022-04-18 HISTORY — PX: COLONOSCOPY WITH PROPOFOL: SHX5780

## 2022-04-18 SURGERY — COLONOSCOPY WITH PROPOFOL
Anesthesia: General

## 2022-04-18 MED ORDER — LACTATED RINGERS IV SOLN: INTRAVENOUS | Status: DC

## 2022-04-18 MED ORDER — LIDOCAINE HCL (CARDIAC) PF 100 MG/5ML IV SOSY
PREFILLED_SYRINGE | INTRAVENOUS | Status: DC | PRN
  Administered 2022-04-18: 50 mg via INTRAVENOUS

## 2022-04-18 MED ORDER — PROPOFOL 10 MG/ML IV BOLUS
INTRAVENOUS | Status: DC | PRN
  Administered 2022-04-18: 100 mg via INTRAVENOUS
  Administered 2022-04-18 (×6): 50 mg via INTRAVENOUS

## 2022-04-18 NOTE — Op Note (Signed)
[facility] ?Patient Name: Louis Sandoval ?Procedure Date: 04/18/2022 11:00 AM ?MRN: [id number] ?Date of Birth: 1971-09-14 ?Attending MD: Elon Alas. Abbey Chatters , DO ?CSN: [id number] ?Age: 51 ?Admit Type: Outpatient ?Procedure:                Colonoscopy ?Indications:              Screening for colorectal malignant neoplasm ?Providers:                Elon Alas. Abbey Chatters, DO, Janeece Riggers, RN, Crisann  ?                          Wynonia Lawman, Technician ?Referring MD:              ?Medicines:                See the Anesthesia note for documentation of the  ?                          administered medications ?Complications:            No immediate complications. ?Estimated Blood Loss:     Estimated blood loss was minimal. ?Procedure:                Pre-Anesthesia Assessment: ?                          - The anesthesia plan was to use monitored  ?                          anesthesia care (MAC). ?                          After obtaining informed consent, the colonoscope  ?                          was passed under direct vision. Throughout the  ?                          procedure, the patient's blood pressure, pulse, and  ?                          oxygen saturations were monitored continuously. The  ?                          PCF-HQ190L ([id number]) scope was introduced through  ?                          the anus and advanced to the the cecum, identified  ?                          by appendiceal orifice and ileocecal valve. The  ?                          colonoscopy was performed without difficulty. The  ?                          patient tolerated the procedure well. The  quality  ?                          of the bowel preparation was evaluated using the  ?                          BBPS [facility] Bowel Preparation Scale) with scores  ?                          of: Right Colon = 2 (minor amount of residual  ?                          staining, small fragments of stool and/or opaque  ?                          liquid, but mucosa  seen well), Transverse Colon = 3  ?                          (entire mucosa seen well with no residual staining,  ?                          small fragments of stool or opaque liquid) and Left  ?                          Colon = 3 (entire mucosa seen well with no residual  ?                          staining, small fragments of stool or opaque  ?                          liquid). The total BBPS score equals 8. The quality  ?                          of the bowel preparation was good. ?Scope In: 11:08:30 AM ?Scope Out: 11:27:14 AM ?Scope Withdrawal Time: 0 hours 13 minutes 42 seconds  ?Total Procedure Duration: 0 hours 18 minutes 44 seconds  ?Findings: ?     The perianal and digital rectal examinations were normal. ?     Non-bleeding internal hemorrhoids were found during endoscopy. ?     Multiple medium-mouthed diverticula were found in the sigmoid colon and  ?     descending colon. ?     A 5 mm polyp was found in the descending colon. The polyp was sessile.  ?     The polyp was removed with a cold snare. Resection and retrieval were  ?     complete. ?     A 7 mm polyp was found in the rectum. The polyp was semi-pedunculated.  ?     The polyp was removed with a cold snare. Resection and retrieval were  ?     complete. ?     The exam was otherwise without abnormality. ?Impression:               - Non-bleeding internal hemorrhoids. ?                          -  Diverticulosis in the sigmoid colon and in the  ?                          descending colon. ?                          - One 5 mm polyp in the descending colon, removed  ?                          with a cold snare. Resected and retrieved. ?                          - One 7 mm polyp in the rectum, removed with a cold  ?                          snare. Resected and retrieved. ?                          - The examination was otherwise normal. ?Moderate Sedation: ?     Per Anesthesia Care ?Recommendation:           - Patient has a contact number available for  ?                           emergencies. The signs and symptoms of potential  ?                          delayed complications were discussed with the  ?                          patient. Return to normal activities tomorrow.  ?                          Written discharge instructions were provided to the  ?                          patient. ?                          - Resume previous diet. ?                          - Continue present medications. ?                          - Await pathology results. ?                          - Repeat colonoscopy in 5 years for surveillance. ?                          - Return to GI clinic PRN. ?Procedure Code(s):        --- Professional --- ?                          [phone], Colonoscopy, flexible; with removal of  ?  tumor(s), polyp(s), or other lesion(s) by snare  ?                          technique ?Diagnosis Code(s):        --- Professional --- ?                          Z12.11, Encounter for screening for malignant  ?                          neoplasm of colon ?                          K64.8, Other hemorrhoids ?                          K63.5, Polyp of colon ?                          K62.1, Rectal polyp ?                          K57.30, Diverticulosis of large intestine without  ?                          perforation or abscess without bleeding ?CPT copyright 2019 American Medical Association. All rights reserved. ?The codes documented in this report are preliminary and upon coder review may  ?be revised to meet current compliance requirements. ?Elon Alas. Abbey Chatters, DO ?Elon Alas. Abbey Chatters, DO ?04/18/2022 11:30:35 AM ?This report has been signed electronically. ?Number of Addenda: 0 ?

## 2022-04-18 NOTE — H&P (Addendum)
Primary Care Physician:  Neale Burly, MD ?Primary Gastroenterologist:  Dr. Abbey Chatters ? ?Pre-Procedure History & Physical: ?HPI:  Louis Sandoval is a 51 y.o. male is here for a colonoscopy for colon cancer screening purposes.  Patient denies any family history of colorectal cancer.  Mild rectal bleeding previously, has stopped.   No abdominal pain or unintentional weight loss.  No change in bowel habits.  Overall feels well from a GI standpoint. ? ?Past Medical History:  ?Diagnosis Date  ? Anxiety   ? Chronic back pain   ? Chronic knee pain   ? left, has a rod  ? HTN (hypertension)   ? ? ?Past Surgical History:  ?Procedure Laterality Date  ? rod placed in left leg    ? ? ?Prior to Admission medications   ?Medication Sig Start Date End Date Taking? Authorizing Provider  ?amLODipine (NORVASC) 5 MG tablet Take 5 mg by mouth daily. 02/03/22  Yes [provider]  ?aspirin EC 81 MG tablet Take 81 mg by mouth daily. Swallow whole.   Yes [provider]  ?busPIRone (BUSPAR) 10 MG tablet Take 10 mg by mouth 2 (two) times daily. 12/02/21  Yes [provider]  ?diclofenac Sodium (VOLTAREN) 1 % GEL Apply 1 application. topically daily as needed (pain).   Yes [provider]  ?gabapentin (NEURONTIN) 300 MG capsule Take 300 mg by mouth 3 (three) times daily. 02/07/22  Yes [provider]  ?HYDROcodone-acetaminophen (NORCO) 10-325 MG tablet Take 1 tablet by mouth 3 (three) times daily. 02/05/22  Yes [provider]  ?loratadine (CLARITIN) 10 MG tablet Take 10 mg by mouth daily.   Yes [provider]  ?Multiple Vitamin (MULTIVITAMIN) tablet Take 1 tablet by mouth daily.   Yes [provider]  ?pantoprazole (PROTONIX) 40 MG tablet Take 40 mg by mouth daily. 01/01/22  Yes [provider]  ?potassium chloride SA (KLOR-CON M) 20 MEQ tablet Take 20 mEq by mouth daily. 12/12/21  Yes [provider]  ?polyethylene glycol-electrolytes (TRILYTE) 420 g solution  Take 4,000 mLs by mouth as directed. 04/07/22   Eloise Harman, DO  ? ? ?Allergies as of 03/18/2022 - Review Complete 02/20/2022  ?Allergen Reaction Noted  ? Penicillins Anaphylaxis 02/20/2022  ? ? ?Family History  ?Problem Relation Age of Onset  ? Breast cancer Mother   ? Gastric cancer Maternal Grandfather   ? Colon cancer Neg Hx   ? ? ?Social History  ? ?Socioeconomic History  ? Marital status: Single  ?  Spouse name: Not on file  ? Number of children: Not on file  ? Years of education: Not on file  ? Highest education level: Not on file  ?Occupational History  ? Not on file  ?Tobacco Use  ? Smoking status: Former  ?  Types: Cigarettes  ? Smokeless tobacco: Never  ?Substance and Sexual Activity  ? Alcohol use: Yes  ?  Comment: occ now; more etoh in past  ? Drug use: Not Currently  ? Sexual activity: Not on file  ?Other Topics Concern  ? Not on file  ?Social History Narrative  ? Not on file  ? ?Social Determinants of Health  ? ?Financial Resource Strain: Not on file  ?Food Insecurity: Not on file  ?Transportation Needs: Not on file  ?Physical Activity: Not on file  ?Stress: Not on file  ?Social Connections: Not on file  ?Intimate Partner Violence: Not on file  ? ? ?Review of Systems: ?See HPI, otherwise negative  ROS ? ?Physical Exam: ?Vital signs in last 24 hours: ?Temp:  [98 ?F (36.7 ?C)] 98 ?F (36.7 ?C) (04/21 1048) ?Pulse Rate:  [62] 62 (04/21 1048) ?Resp:  [14] 14 (04/21 1048) ?BP: (136)/(81) 136/81 (04/21 1048) ?SpO2:  [99 %] 99 % (04/21 1048) ?Weight:  [105.7 kg] 105.7 kg (04/21 1048) ?  ?General:   Alert,  Well-developed, well-nourished, pleasant and cooperative in NAD ?Head:  Normocephalic and atraumatic. ?Eyes:  Sclera clear, no icterus.   Conjunctiva pink. ?Ears:  Normal auditory acuity. ?Nose:  No deformity, discharge,  or lesions. ?Mouth:  No deformity or lesions, dentition normal. ?Neck:  Supple; no masses or thyromegaly. ?Lungs:  Clear throughout to auscultation.   No wheezes, crackles, or rhonchi.  No acute distress. ?Heart:  Regular rate and rhythm; no murmurs, clicks, rubs,  or gallops. ?Abdomen:  Soft, nontender and nondistended. No masses, hepatosplenomegaly or hernias noted. Normal bowel sounds, without guarding, and without rebound.   ?Msk:  Symmetrical without gross deformities. Normal posture. ?Extremities:  Without clubbing or edema. ?Neurologic:  Alert and  oriented x4;  grossly normal neurologically. ?Skin:  Intact without significant lesions or rashes. ?Cervical Nodes:  No significant cervical adenopathy. ?Psych:  Alert and cooperative. Normal mood and affect. ? ?Impression/Plan: ?Louis Sandoval is here for a colonoscopy to be performed for colon cancer screening purposes. ? ?The risks of the procedure including infection, bleed, or perforation as well as benefits, limitations, alternatives and imponderables have been reviewed with the patient. Questions have been answered. All parties agreeable. ? ?

## 2022-04-18 NOTE — Discharge Instructions (Addendum)

## 2022-04-18 NOTE — Anesthesia Postprocedure Evaluation (Signed)
Anesthesia Post Note ? ?Patient: Louis Sandoval ? ?Procedure(s) Performed: COLONOSCOPY WITH PROPOFOL ?POLYPECTOMY ? ?Patient location during evaluation: Phase II ?Anesthesia Type: General ?Level of consciousness: awake ?Pain management: pain level controlled ?Vital Signs Assessment: post-procedure vital signs reviewed and stable ?Respiratory status: spontaneous breathing and respiratory function stable ?Cardiovascular status: blood pressure returned to baseline and stable ?Postop Assessment: no headache and no apparent nausea or vomiting ?Anesthetic complications: no ?Comments: Late entry ? ? ?No notable events documented. ? ? ?Last Vitals:  ?Vitals:  ? 04/18/22 1048 04/18/22 1131  ?BP: 136/81 111/73  ?Pulse: 62 65  ?Resp: 14 14  ?Temp: 36.7 ?C 36.5 ?C  ?SpO2: 99% 100%  ?  ?Last Pain:  ?Vitals:  ? 04/18/22 1131  ?TempSrc: Oral  ?PainSc: 0-No pain  ? ? ?  ?  ?  ?  ?  ?  ? ?Louann Sjogren ? ? ? ? ?

## 2022-04-18 NOTE — Anesthesia Procedure Notes (Signed)
Date/Time: 04/18/2022 11:10 AM ?Performed by: Orlie Dakin, CRNA ?Pre-anesthesia Checklist: Patient identified, Emergency Drugs available, Patient being monitored and Suction available ?Patient Re-evaluated:Patient Re-evaluated prior to induction ?Oxygen Delivery Method: Nasal cannula ?Induction Type: IV induction ?Placement Confirmation: positive ETCO2 ? ? ? ? ?

## 2022-04-18 NOTE — Transfer of Care (Signed)
Immediate Anesthesia Transfer of Care Note ? ?Patient: Louis Sandoval ? ?Procedure(s) Performed: COLONOSCOPY WITH PROPOFOL ?POLYPECTOMY ? ?Patient Location: Endoscopy Unit ? ?Anesthesia Type:General ? ?Level of Consciousness: awake ? ?Airway & Oxygen Therapy: Patient Spontanous Breathing ? ?Post-op Assessment: Report given to RN and Post -op Vital signs reviewed and stable ? ?Post vital signs: Reviewed and stable ? ?Last Vitals:  ?Vitals Value Taken Time  ?BP    ?Temp    ?Pulse    ?Resp    ?SpO2    ? ? ?Last Pain:  ?Vitals:  ? 04/18/22 1104  ?TempSrc:   ?PainSc: 0-No pain  ?   ? ?  ? ?Complications: No notable events documented. ?

## 2022-04-18 NOTE — Anesthesia Preprocedure Evaluation (Signed)
Anesthesia Evaluation  Patient identified by MRN, date of birth, ID band Patient awake    Reviewed: Allergy & Precautions, H&P , NPO status , Patient's Chart, lab work & pertinent test results, reviewed documented beta blocker date and time   Airway Mallampati: II  TM Distance: >3 FB Neck ROM: full    Dental no notable dental hx.    Pulmonary neg pulmonary ROS, former smoker,    Pulmonary exam normal breath sounds clear to auscultation       Cardiovascular Exercise Tolerance: Good hypertension, negative cardio ROS   Rhythm:regular Rate:Normal     Neuro/Psych PSYCHIATRIC DISORDERS Anxiety negative neurological ROS     GI/Hepatic negative GI ROS, Neg liver ROS,   Endo/Other  negative endocrine ROS  Renal/GU negative Renal ROS  negative genitourinary   Musculoskeletal   Abdominal   Peds  Hematology negative hematology ROS (+)   Anesthesia Other Findings   Reproductive/Obstetrics negative OB ROS                             Anesthesia Physical Anesthesia Plan  ASA: 2  Anesthesia Plan: General   Post-op Pain Management:    Induction:   PONV Risk Score and Plan: Propofol infusion  Airway Management Planned:   Additional Equipment:   Intra-op Plan:   Post-operative Plan:   Informed Consent: I have reviewed the patients History and Physical, chart, labs and discussed the procedure including the risks, benefits and alternatives for the proposed anesthesia with the patient or authorized representative who has indicated his/her understanding and acceptance.     Dental Advisory Given  Plan Discussed with: CRNA  Anesthesia Plan Comments:         Anesthesia Quick Evaluation  

## 2022-04-21 LAB — SURGICAL PATHOLOGY

## 2022-04-22 ENCOUNTER — Encounter (HOSPITAL_COMMUNITY): Payer: Self-pay | Admitting: Internal Medicine

## 2022-05-06 DIAGNOSIS — E1143 Type 2 diabetes mellitus with diabetic autonomic (poly)neuropathy: Secondary | ICD-10-CM | POA: Diagnosis not present

## 2022-05-06 DIAGNOSIS — I10 Essential (primary) hypertension: Secondary | ICD-10-CM | POA: Diagnosis not present

## 2022-05-06 DIAGNOSIS — M5459 Other low back pain: Secondary | ICD-10-CM | POA: Diagnosis not present

## 2022-05-06 DIAGNOSIS — Z79899 Other long term (current) drug therapy: Secondary | ICD-10-CM | POA: Diagnosis not present

## 2022-05-06 DIAGNOSIS — F411 Generalized anxiety disorder: Secondary | ICD-10-CM | POA: Diagnosis not present

## 2022-07-09 DIAGNOSIS — I10 Essential (primary) hypertension: Secondary | ICD-10-CM | POA: Diagnosis not present

## 2022-07-09 DIAGNOSIS — F411 Generalized anxiety disorder: Secondary | ICD-10-CM | POA: Diagnosis not present

## 2022-07-09 DIAGNOSIS — M5459 Other low back pain: Secondary | ICD-10-CM | POA: Diagnosis not present

## 2022-07-09 DIAGNOSIS — Z0000 Encounter for general adult medical examination without abnormal findings: Secondary | ICD-10-CM | POA: Diagnosis not present

## 2022-07-09 DIAGNOSIS — E1143 Type 2 diabetes mellitus with diabetic autonomic (poly)neuropathy: Secondary | ICD-10-CM | POA: Diagnosis not present

## 2022-09-09 DIAGNOSIS — I10 Essential (primary) hypertension: Secondary | ICD-10-CM | POA: Diagnosis not present

## 2022-09-09 DIAGNOSIS — F411 Generalized anxiety disorder: Secondary | ICD-10-CM | POA: Diagnosis not present

## 2022-09-09 DIAGNOSIS — M5459 Other low back pain: Secondary | ICD-10-CM | POA: Diagnosis not present

## 2022-09-09 DIAGNOSIS — E1143 Type 2 diabetes mellitus with diabetic autonomic (poly)neuropathy: Secondary | ICD-10-CM | POA: Diagnosis not present

## 2022-09-16 ENCOUNTER — Encounter: Payer: Self-pay | Admitting: Gastroenterology

## 2022-09-16 NOTE — Progress Notes (Deleted)
Referring Provider: Neale Burly, MD Primary Care Physician:  Neale Burly, MD Primary GI Physician: Dr. Abbey Chatters  No chief complaint on file.   HPI:   Louis Sandoval is a 51 y.o. male presenting today for follow-up of rectal bleeding, constipation, and abdominal pain s/p colonoscopy.   Last seen in our office 02/20/2022 at the time of initial consult.  Reported 1 year history of intermittent toilet tissue hematochezia.  Occasional constipation in the setting of chronic pain medications, but usually bowels moving 1-2 times daily.  Using over-the-counter stool softener as needed.  Also reported intermittent upper abdominal burning for the last couple of years taking Protonix as needed.  Symptoms frequently triggered by tomato based products.  No typical heartburn symptoms.  No regular NSAID use aside form 81 mg aspirin. Recommended CBC, colonoscopy, add fiber supplement daily, and hold Protonix x 2 weeks to rule out H pylori.   Hemoglobin 15.4. H. pylori breath test negative.  Recommended starting Protonix 40 mg daily.  Colonoscopy 04/18/2022: Nonbleeding internal hemorrhoids, diverticulosis in sigmoid and descending colon, 5 mm polyp in the descending colon removed, 7 mm polyp in the rectum removed.  Pathology with tubular adenomas.  Recommended 5-year surveillance.  Today:   Rectal bleeding:   Epigastric burning:   Past Medical History:  Diagnosis Date   Anxiety    Chronic back pain    Chronic knee pain    left, has a rod   HTN (hypertension)     Past Surgical History:  Procedure Laterality Date   COLONOSCOPY WITH PROPOFOL N/A 04/18/2022   Procedure: COLONOSCOPY WITH PROPOFOL;  Surgeon: Eloise Harman, DO;  Location: [facility];  Service: Endoscopy;  Laterality: N/A;  12:15pm   POLYPECTOMY  04/18/2022   Procedure: POLYPECTOMY;  Surgeon: Eloise Harman, DO;  Location: [facility];  Service: Endoscopy;;   rod placed in left leg      Current Outpatient  Medications  Medication Sig Dispense Refill   amLODipine (NORVASC) 5 MG tablet Take 5 mg by mouth daily.     aspirin EC 81 MG tablet Take 81 mg by mouth daily. Swallow whole.     busPIRone (BUSPAR) 10 MG tablet Take 10 mg by mouth 2 (two) times daily.     diclofenac Sodium (VOLTAREN) 1 % GEL Apply 1 application. topically daily as needed (pain).     gabapentin (NEURONTIN) 300 MG capsule Take 300 mg by mouth 3 (three) times daily.     HYDROcodone-acetaminophen (NORCO) 10-325 MG tablet Take 1 tablet by mouth 3 (three) times daily.     loratadine (CLARITIN) 10 MG tablet Take 10 mg by mouth daily.     Multiple Vitamin (MULTIVITAMIN) tablet Take 1 tablet by mouth daily.     pantoprazole (PROTONIX) 40 MG tablet Take 40 mg by mouth daily.     potassium chloride SA (KLOR-CON M) 20 MEQ tablet Take 20 mEq by mouth daily.     No current facility-administered medications for this visit.    Allergies as of 09/18/2022 - Review Complete 04/18/2022  Allergen Reaction Noted   Penicillins Anaphylaxis 02/20/2022    Family History  Problem Relation Age of Onset   Breast cancer Mother    Gastric cancer Maternal Grandfather    Colon cancer Neg Hx     Social History   Socioeconomic History   Marital status: Single    Spouse name: Not on file   Number of children: Not on file  Years of education: Not on file   Highest education level: Not on file  Occupational History   Not on file  Tobacco Use   Smoking status: Former    Types: Cigarettes   Smokeless tobacco: Never  Substance and Sexual Activity   Alcohol use: Yes    Comment: occ now; more etoh in past   Drug use: Not Currently   Sexual activity: Not on file  Other Topics Concern   Not on file  Social History Narrative   Not on file   Social Determinants of Health   Financial Resource Strain: Not on file  Food Insecurity: Not on file  Transportation Needs: Not on file  Physical Activity: Not on file  Stress: Not on file  Social  Connections: Not on file    Review of Systems: Gen: Denies fever, chills, cold or flu like symptoms, pre-syncope, or syncope.  CV: Denies chest pain, palpitations. Resp: Denies dyspnea, cough.  GI: See HPI  Heme: See HIP  Physical Exam: There were no vitals taken for this visit. General:   Alert and oriented. No distress noted. Pleasant and cooperative.  Head:  Normocephalic and atraumatic. Eyes:  Conjuctiva clear without scleral icterus. Heart:  S1, S2 present without murmurs appreciated. Lungs:  Clear to auscultation bilaterally. No wheezes, rales, or rhonchi. No distress.  Abdomen:  +BS, soft, non-tender and non-distended. No rebound or guarding. No HSM or masses noted. Msk:  Symmetrical without gross deformities. Normal posture. Extremities:  Without edema. Neurologic:  Alert and  oriented x4 Psych:  Normal mood and affect.    Assessment:     Plan:  ***   Aliene Altes, PA-C [facility] 09/18/2022

## 2022-09-18 ENCOUNTER — Ambulatory Visit: Payer: BC Managed Care – PPO | Admitting: Gastroenterology

## 2022-11-05 DIAGNOSIS — I10 Essential (primary) hypertension: Secondary | ICD-10-CM | POA: Diagnosis not present

## 2022-11-05 DIAGNOSIS — F411 Generalized anxiety disorder: Secondary | ICD-10-CM | POA: Diagnosis not present

## 2022-11-05 DIAGNOSIS — E1143 Type 2 diabetes mellitus with diabetic autonomic (poly)neuropathy: Secondary | ICD-10-CM | POA: Diagnosis not present

## 2022-11-05 DIAGNOSIS — M5459 Other low back pain: Secondary | ICD-10-CM | POA: Diagnosis not present

## 2023-01-07 DIAGNOSIS — F411 Generalized anxiety disorder: Secondary | ICD-10-CM | POA: Diagnosis not present

## 2023-01-07 DIAGNOSIS — M5459 Other low back pain: Secondary | ICD-10-CM | POA: Diagnosis not present

## 2023-01-07 DIAGNOSIS — E1143 Type 2 diabetes mellitus with diabetic autonomic (poly)neuropathy: Secondary | ICD-10-CM | POA: Diagnosis not present

## 2023-01-07 DIAGNOSIS — I10 Essential (primary) hypertension: Secondary | ICD-10-CM | POA: Diagnosis not present

## 2023-03-03 DIAGNOSIS — Z0000 Encounter for general adult medical examination without abnormal findings: Secondary | ICD-10-CM | POA: Diagnosis not present

## 2023-03-03 DIAGNOSIS — Z6829 Body mass index (BMI) 29.0-29.9, adult: Secondary | ICD-10-CM | POA: Diagnosis not present

## 2023-05-06 DIAGNOSIS — M10031 Idiopathic gout, right wrist: Secondary | ICD-10-CM | POA: Diagnosis not present

## 2023-05-06 DIAGNOSIS — M5459 Other low back pain: Secondary | ICD-10-CM | POA: Diagnosis not present

## 2023-05-06 DIAGNOSIS — E1143 Type 2 diabetes mellitus with diabetic autonomic (poly)neuropathy: Secondary | ICD-10-CM | POA: Diagnosis not present

## 2023-05-06 DIAGNOSIS — F411 Generalized anxiety disorder: Secondary | ICD-10-CM | POA: Diagnosis not present

## 2023-07-06 DIAGNOSIS — M10031 Idiopathic gout, right wrist: Secondary | ICD-10-CM | POA: Diagnosis not present

## 2023-07-06 DIAGNOSIS — Z0000 Encounter for general adult medical examination without abnormal findings: Secondary | ICD-10-CM | POA: Diagnosis not present

## 2023-07-06 DIAGNOSIS — E1143 Type 2 diabetes mellitus with diabetic autonomic (poly)neuropathy: Secondary | ICD-10-CM | POA: Diagnosis not present

## 2023-07-06 DIAGNOSIS — M5459 Other low back pain: Secondary | ICD-10-CM | POA: Diagnosis not present

## 2023-07-06 DIAGNOSIS — F411 Generalized anxiety disorder: Secondary | ICD-10-CM | POA: Diagnosis not present

## 2023-07-06 DIAGNOSIS — Z125 Encounter for screening for malignant neoplasm of prostate: Secondary | ICD-10-CM | POA: Diagnosis not present

## 2023-07-14 IMAGING — CT CT HEAD W/O CM
3 series · 15 of 47 positions shown, 18 images · non-contrast
Comparison: CT head 01/20/2007

CLINICAL DATA: Right side headache 3 weeks ago, no headaches since.
Denies injury or any other symptoms.



[Series 2: head w o · axial · 0.46mm/px · z∈[+83,+223]mm · 9 of 34 slices shown, 12 images]
[im 3/34  brain]
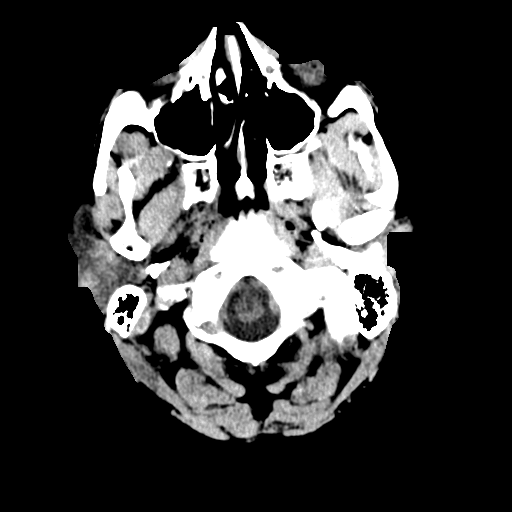
[im 3/34  bone]
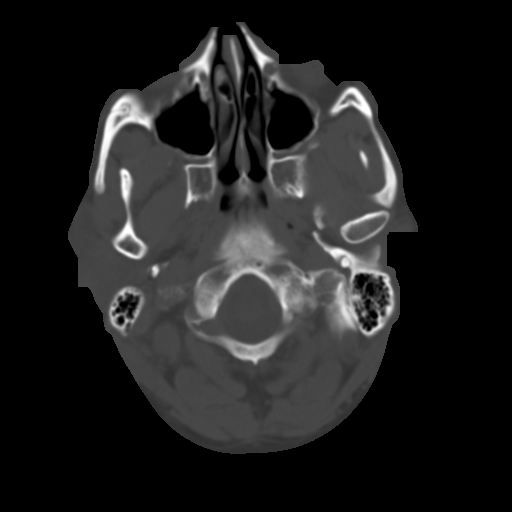
[im 6/34  brain]
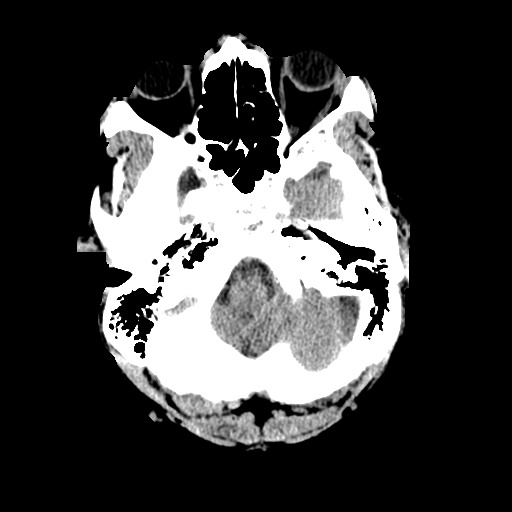
[im 10/34  brain]
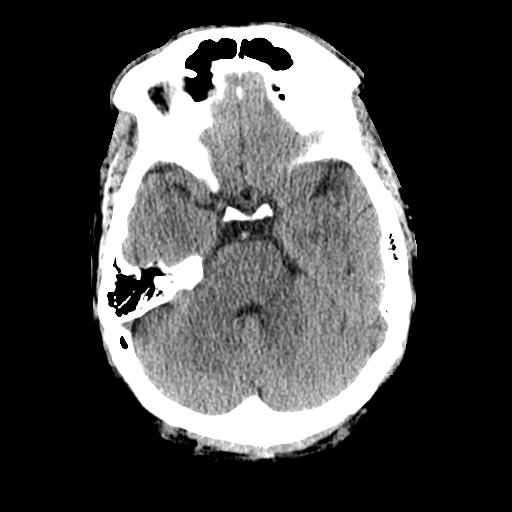
[im 13/34  brain]
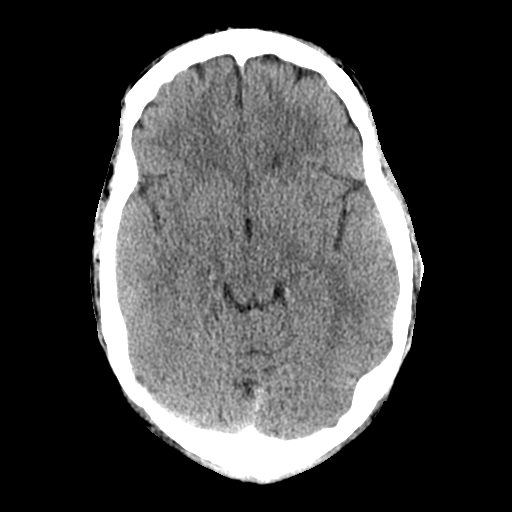
[im 18/34  brain]
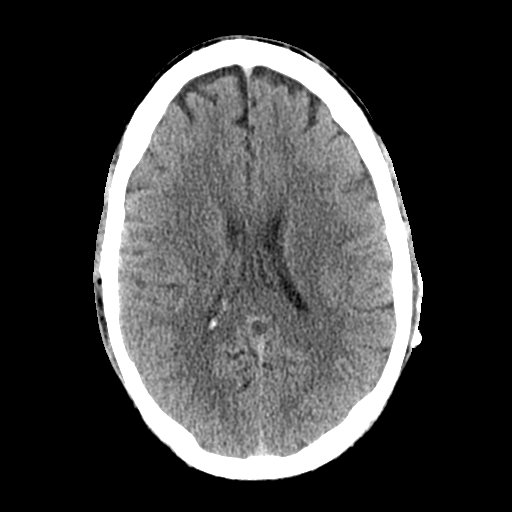
[im 18/34  bone]
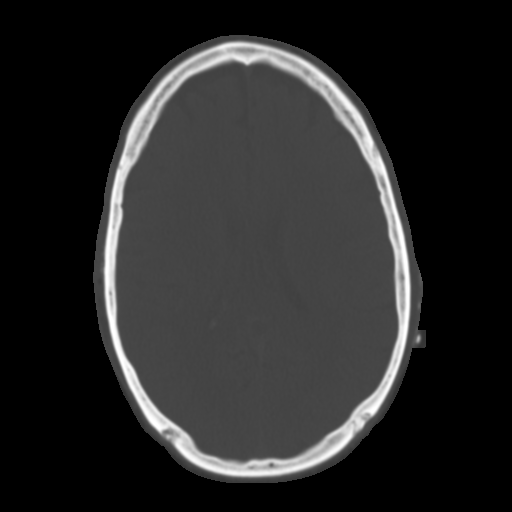
[im 21/34  brain]
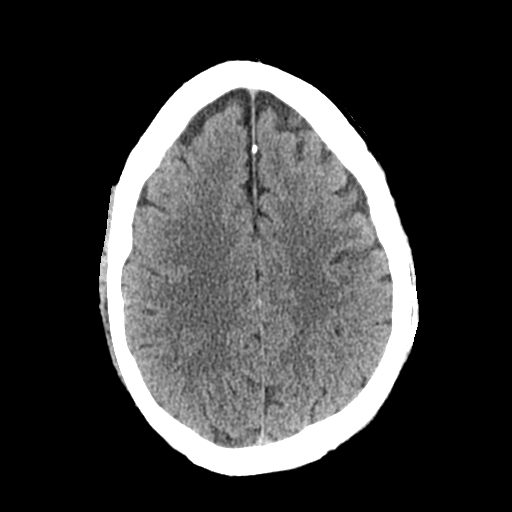
[im 24/34  brain]
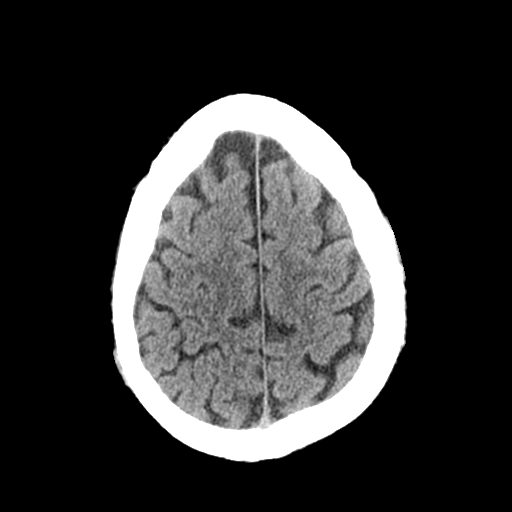
[im 28/34  brain]
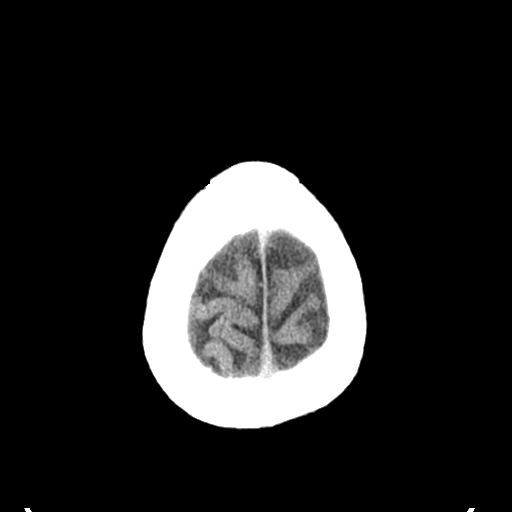
[im 31/34  brain]
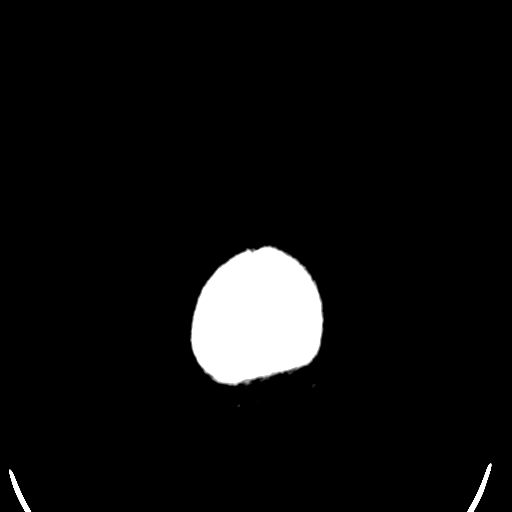
[im 31/34  bone]
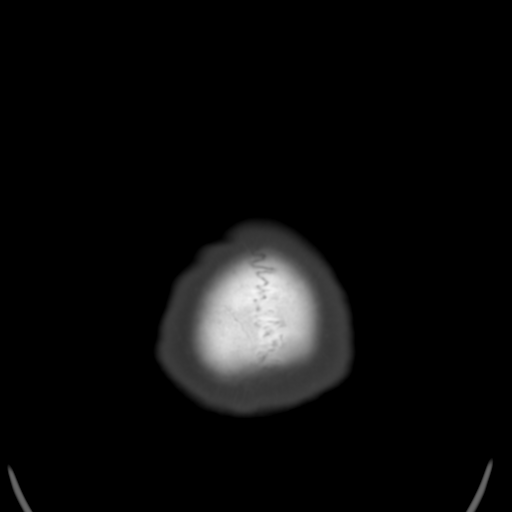

[Series 4: coronal soft · coronal · 0.34mm/px · 3 of 82 slices shown]
[im 28/82  brain]
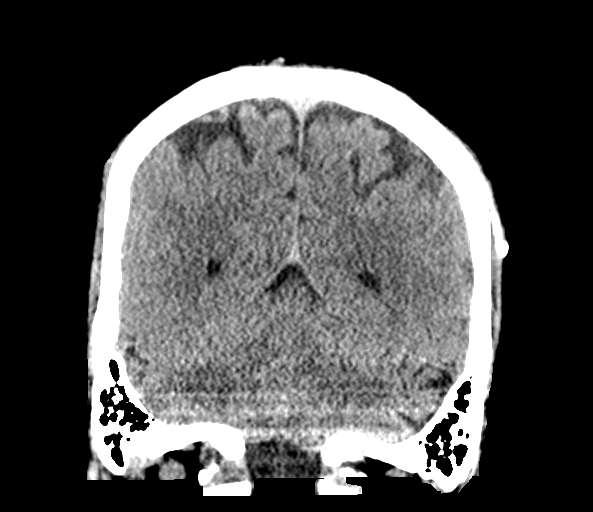
[im 37/82  brain]
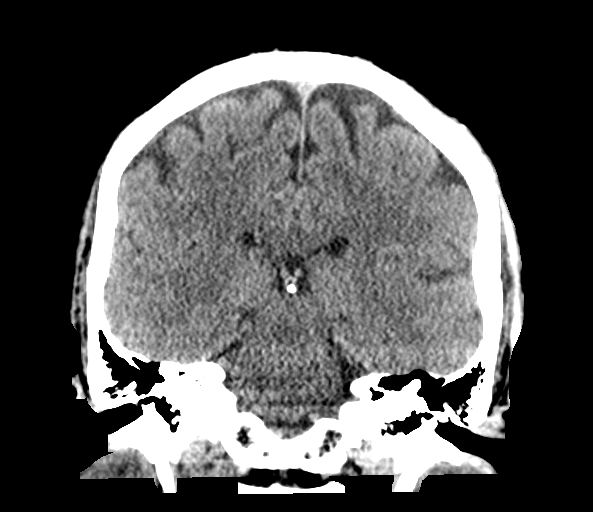
[im 46/82  brain]
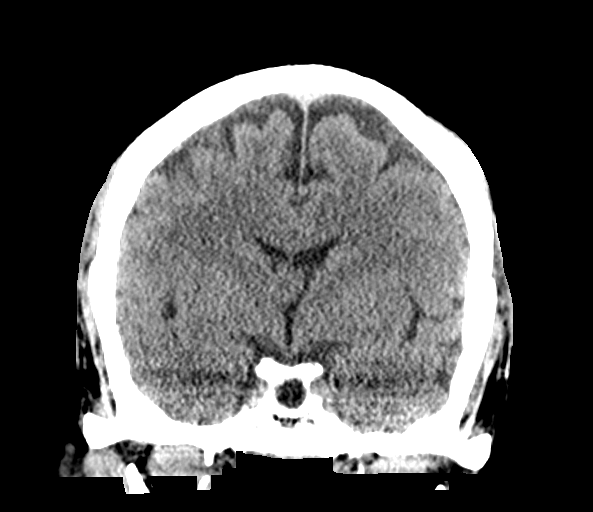

[Series 5: sagittal soft · sagittal · 0.38mm/px · 3 of 64 slices shown]
[im 22/64  brain]
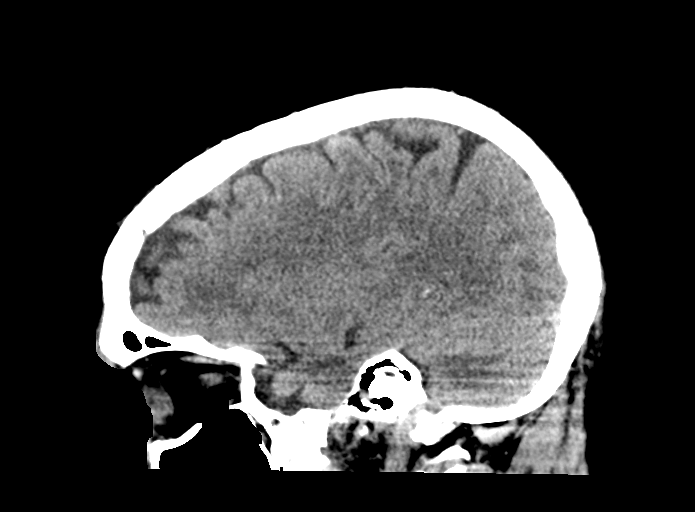
[im 32/64  brain]
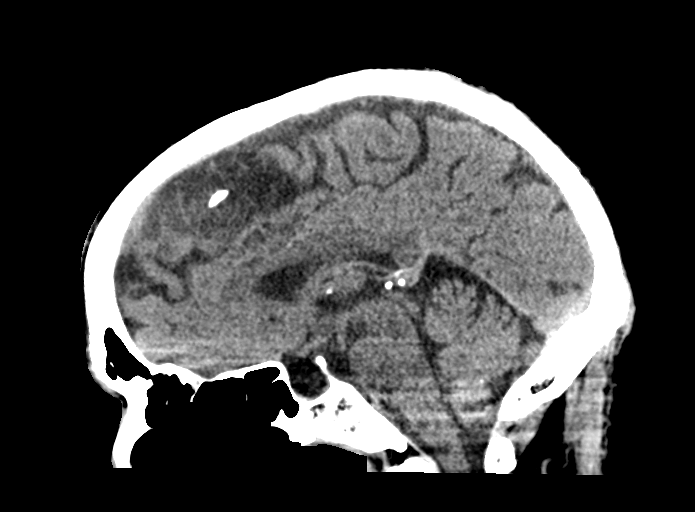
[im 43/64  brain]
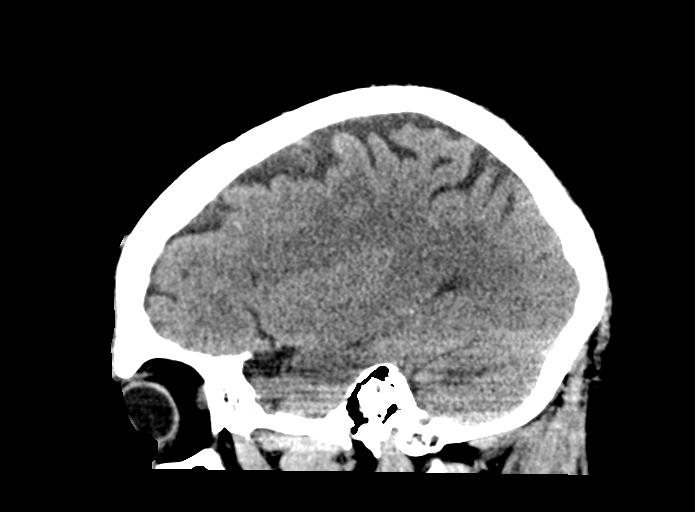

[15 of 47 positions shown; findings below may reference images not displayed]

FINDINGS: Brain:

No evidence of large-territorial acute infarction. No parenchymal
hemorrhage. No mass lesion. No extra-axial collection.

No mass effect or midline shift. No hydrocephalus. Basilar cisterns
are patent.

Empty sella.

Vascular: No hyperdense vessel.

Skull: No acute fracture or focal lesion.

Sinuses/Orbits: Paranasal sinuses and mastoid air cells are clear.
The orbits are unremarkable.

Other: None.
IMPRESSION: 1. No acute intracranial abnormality.
2. Empty sella. Findings is often a normal anatomic variant but can
be associated with idiopathic intracranial hypertension (pseudotumor
cerebri).

## 2023-09-10 DIAGNOSIS — G4701 Insomnia due to medical condition: Secondary | ICD-10-CM | POA: Diagnosis not present

## 2023-09-10 DIAGNOSIS — M5459 Other low back pain: Secondary | ICD-10-CM | POA: Diagnosis not present

## 2023-09-10 DIAGNOSIS — F411 Generalized anxiety disorder: Secondary | ICD-10-CM | POA: Diagnosis not present

## 2023-09-10 DIAGNOSIS — Z6830 Body mass index (BMI) 30.0-30.9, adult: Secondary | ICD-10-CM | POA: Diagnosis not present

## 2023-11-02 DIAGNOSIS — G4701 Insomnia due to medical condition: Secondary | ICD-10-CM | POA: Diagnosis not present

## 2023-11-02 DIAGNOSIS — M10031 Idiopathic gout, right wrist: Secondary | ICD-10-CM | POA: Diagnosis not present

## 2023-11-02 DIAGNOSIS — E1143 Type 2 diabetes mellitus with diabetic autonomic (poly)neuropathy: Secondary | ICD-10-CM | POA: Diagnosis not present

## 2023-11-02 DIAGNOSIS — I10 Essential (primary) hypertension: Secondary | ICD-10-CM | POA: Diagnosis not present

## 2023-12-28 ENCOUNTER — Other Ambulatory Visit: Payer: Self-pay | Admitting: Internal Medicine

## 2023-12-28 MED ORDER — HYDROCORTISONE (PERIANAL) 2.5 % EX CREA: 1.0000 | TOPICAL_CREAM | Freq: Two times a day (BID) | CUTANEOUS | 1 refills | Status: AC

## 2024-01-05 DIAGNOSIS — E1143 Type 2 diabetes mellitus with diabetic autonomic (poly)neuropathy: Secondary | ICD-10-CM | POA: Diagnosis not present

## 2024-01-05 DIAGNOSIS — I10 Essential (primary) hypertension: Secondary | ICD-10-CM | POA: Diagnosis not present

## 2024-01-05 DIAGNOSIS — M10031 Idiopathic gout, right wrist: Secondary | ICD-10-CM | POA: Diagnosis not present

## 2024-01-05 DIAGNOSIS — F411 Generalized anxiety disorder: Secondary | ICD-10-CM | POA: Diagnosis not present

## 2024-01-05 DIAGNOSIS — M5459 Other low back pain: Secondary | ICD-10-CM | POA: Diagnosis not present

## 2024-01-05 DIAGNOSIS — G4701 Insomnia due to medical condition: Secondary | ICD-10-CM | POA: Diagnosis not present

## 2024-03-07 ENCOUNTER — Ambulatory Visit: Payer: Self-pay

## 2024-03-07 ENCOUNTER — Other Ambulatory Visit: Payer: Self-pay | Admitting: Family Medicine

## 2024-03-07 DIAGNOSIS — Z021 Encounter for pre-employment examination: Secondary | ICD-10-CM

## 2024-04-18 ENCOUNTER — Telehealth: Payer: Self-pay | Admitting: Internal Medicine

## 2024-04-18 NOTE — Telephone Encounter (Signed)
 Called but no answer and left patient a message to call the office to get scheduled for OV for constipation

## 2024-04-18 NOTE — Telephone Encounter (Signed)
 Patient needs OV with me for constipation, can we call and get him scheduled? Thank you

## 2024-04-28 ENCOUNTER — Ambulatory Visit (INDEPENDENT_AMBULATORY_CARE_PROVIDER_SITE_OTHER): Payer: Self-pay | Admitting: Internal Medicine

## 2024-04-28 VITALS — BP 145/92 | HR 66 | Temp 98.6°F | Ht 73.0 in | Wt 255.2 lb

## 2024-04-28 DIAGNOSIS — K5900 Constipation, unspecified: Secondary | ICD-10-CM

## 2024-04-28 DIAGNOSIS — Z860101 Personal history of adenomatous and serrated colon polyps: Secondary | ICD-10-CM

## 2024-04-28 DIAGNOSIS — R140 Abdominal distension (gaseous): Secondary | ICD-10-CM

## 2024-04-28 DIAGNOSIS — K5903 Drug induced constipation: Secondary | ICD-10-CM

## 2024-04-28 NOTE — Progress Notes (Signed)
 Referring Provider: Veda Gerald, MD Primary Care Physician:  Veda Gerald, MD Primary GI:  Dr. Mordechai April  Chief Complaint  Patient presents with   New Patient (Initial Visit)    Pt referred for constipation, bloating and burping    HPI:   Louis Sandoval is a 53 y.o. male who presents to the clinic today to discuss constipation.  Reports symptoms have progressively worsened over the last few months.  Will average 1 bowel movement per day though sometimes [place] 1 and 2.  States he recently started olmesartan approximately 2 months ago which he believes have exacerbated his symptoms.  Also chronically on opioids.  Occasional bright red blood on tissue.  Taking MiraLAX 2 capfuls daily which is helping some though does miss doses often.  Colonoscopy 04/18/2022 with internal hemorrhoids, sigmoid/descending diverticulosis, 2 adenomas removed.  Recommended 5-year recall.  Past Medical History:  Diagnosis Date   Anxiety    Chronic back pain    Chronic knee pain    left, has a rod   HTN (hypertension)     Past Surgical History:  Procedure Laterality Date   COLONOSCOPY WITH PROPOFOL  N/A 04/18/2022   Surgeon: Vinetta Greening, DO; Nonbleeding internal hemorrhoids, diverticulosis in sigmoid and descending colon, 5 mm polyp in the descending colon removed, 7 mm polyp in the rectum removed.  Pathology with tubular adenomas.  Recommended 5-year surveillance.   POLYPECTOMY  04/18/2022   Procedure: POLYPECTOMY;  Surgeon: Vinetta Greening, DO;  Location: [facility];  Service: Endoscopy;;   rod placed in left leg      Current Outpatient Medications  Medication Sig Dispense Refill   aspirin EC 81 MG tablet Take 81 mg by mouth daily. Swallow whole.     busPIRone (BUSPAR) 10 MG tablet Take 10 mg by mouth 2 (two) times daily.     diclofenac Sodium (VOLTAREN) 1 % GEL Apply 1 application. topically daily as needed (pain).     gabapentin (NEURONTIN) 300 MG capsule Take 300 mg by  mouth 3 (three) times daily.     HYDROcodone-acetaminophen (NORCO) 10-325 MG tablet Take 1 tablet by mouth 3 (three) times daily.     loratadine (CLARITIN) 10 MG tablet Take 10 mg by mouth daily.     Multiple Vitamin (MULTIVITAMIN) tablet Take 1 tablet by mouth daily.     olmesartan (BENICAR) 40 MG tablet Take 40 mg by mouth daily.     pantoprazole (PROTONIX) 40 MG tablet Take 40 mg by mouth daily.     potassium chloride SA (KLOR-CON M) 20 MEQ tablet Take 20 mEq by mouth daily.     amLODipine (NORVASC) 5 MG tablet Take 5 mg by mouth daily. (Patient not taking: Reported on 04/28/2024)     hydrocortisone  (ANUSOL -HC) 2.5 % rectal cream Place 1 Application rectally 2 (two) times daily. (Patient not taking: Reported on 04/28/2024) 30 g 1   No current facility-administered medications for this visit.    Allergies as of 04/28/2024 - Review Complete 04/28/2024  Allergen Reaction Noted   Penicillins Anaphylaxis 02/20/2022    Family History  Problem Relation Age of Onset   Breast cancer Mother    Gastric cancer Maternal Grandfather    Colon cancer Neg Hx     Social History   Socioeconomic History   Marital status: Single    Spouse name: Not on file   Number of children: Not on file   Years of education: Not on file   Highest education  level: Not on file  Occupational History   Not on file  Tobacco Use   Smoking status: Former    Types: Cigarettes   Smokeless tobacco: Never  Substance and Sexual Activity   Alcohol use: Yes    Comment: occ now; more etoh in past   Drug use: Not Currently   Sexual activity: Not on file  Other Topics Concern   Not on file  Social History Narrative   Not on file   Social Drivers of Health   Financial Resource Strain: Not on file  Food Insecurity: Not on file  Transportation Needs: Not on file  Physical Activity: Not on file  Stress: Not on file  Social Connections: Not on file    Subjective: Review of Systems  Constitutional:  Negative for  chills and fever.  HENT:  Negative for congestion and hearing loss.   Eyes:  Negative for blurred vision and double vision.  Respiratory:  Negative for cough and shortness of breath.   Cardiovascular:  Negative for chest pain and palpitations.  Gastrointestinal:  Positive for constipation. Negative for abdominal pain, blood in stool, diarrhea, heartburn, melena and vomiting.       Abdominal bloating  Genitourinary:  Negative for dysuria and urgency.  Musculoskeletal:  Negative for joint pain and myalgias.  Skin:  Negative for itching and rash.  Neurological:  Negative for dizziness and headaches.  Psychiatric/Behavioral:  Negative for depression. The patient is not nervous/anxious.      Objective: BP (!) 145/92   Pulse 66   Temp 98.6 F (37 C)   Ht 6\' 1"  (1.854 m)   Wt 255 lb 3.2 oz (115.8 kg)   BMI 33.67 kg/m  Physical Exam Constitutional:      Appearance: Normal appearance.  HENT:     Head: Normocephalic and atraumatic.  Eyes:     Extraocular Movements: Extraocular movements intact.     Conjunctiva/sclera: Conjunctivae normal.  Cardiovascular:     Rate and Rhythm: Normal rate and regular rhythm.  Pulmonary:     Effort: Pulmonary effort is normal.     Breath sounds: Normal breath sounds.  Abdominal:     General: Bowel sounds are normal.     Palpations: Abdomen is soft.  Musculoskeletal:        General: Normal range of motion.     Cervical back: Normal range of motion and neck supple.  Skin:    General: Skin is warm.  Neurological:     General: No focal deficit present.     Mental Status: He is alert and oriented to person, place, and time.  Psychiatric:        Mood and Affect: Mood normal.        Behavior: Behavior normal.      Assessment: *Constipation *Abdominal bloating *History of adenomatous colon polyps  Plan: Patient's constipation likely from chronic opioid use.  Recent change in his blood pressure medicine with olmesartan, unsure if playing a  role or not.  Continue MiraLAX 2 capfuls daily.  Louis on once daily Dulcolax.  Let us  know in 3 to 4 weeks, if not improved will consider prescription medication for his symptoms.  Colonoscopy recall April 2028.  04/28/2024 3:57 PM   Disclaimer: This note was dictated with voice recognition software. Similar sounding words can inadvertently be transcribed and may not be corrected upon review.

## 2024-04-28 NOTE — Patient Instructions (Signed)
 For your constipation and abdominal bloating, I want you to continue to take MiraLAX 2 capfuls daily.  Would recommend you add on once daily Dulcolax as well.  Try this for 3 to 4 weeks, if not improving we can give you samples of something stronger.  It was a pleasure seeing you again today.  Dr. Mordechai April

## 2024-11-21 ENCOUNTER — Other Ambulatory Visit (HOSPITAL_COMMUNITY)
Admission: RE | Admit: 2024-11-21 | Discharge: 2024-11-21 | Disposition: A | Discharge status: Home / Self Care | Source: Ambulatory Visit | Attending: Internal Medicine | Admitting: Internal Medicine

## 2024-11-22 LAB — MICROALBUMIN / CREATININE URINE RATIO
Creatinine, Urine: 113.8 mg/dL
Microalb Creat Ratio: 16 mg/g{creat} (ref 0–29)
Microalb, Ur: 17.7 ug/mL — ABNORMAL HIGH
